# Patient Record
Sex: Female | Born: 1980 | Race: White | Hispanic: No | Marital: Married | Smoking: Never smoker
Health system: Southern US, Community
[De-identification: ages and names within clinical notes are randomized; demographics above are authoritative.]

## PROBLEM LIST (undated history)

## (undated) DIAGNOSIS — Z9889 Other specified postprocedural states: Secondary | ICD-10-CM

## (undated) DIAGNOSIS — N6489 Other specified disorders of breast: Secondary | ICD-10-CM

## (undated) DIAGNOSIS — R112 Nausea with vomiting, unspecified: Secondary | ICD-10-CM

---

## 2007-01-06 ENCOUNTER — Ambulatory Visit (HOSPITAL_COMMUNITY): Admission: RE | Admit: 2007-01-06 | Discharge: 2007-01-06 | Payer: Self-pay | Admitting: Obstetrics and Gynecology

## 2007-03-02 ENCOUNTER — Ambulatory Visit (HOSPITAL_COMMUNITY): Admission: RE | Admit: 2007-03-02 | Discharge: 2007-03-02 | Payer: Self-pay | Admitting: Obstetrics and Gynecology

## 2007-04-03 ENCOUNTER — Ambulatory Visit (HOSPITAL_COMMUNITY): Admission: RE | Admit: 2007-04-03 | Discharge: 2007-04-03 | Payer: Self-pay | Admitting: Obstetrics and Gynecology

## 2010-04-15 ENCOUNTER — Encounter: Payer: Self-pay | Admitting: Obstetrics and Gynecology

## 2011-01-03 LAB — MISCELLANEOUS TEST

## 2019-12-04 ENCOUNTER — Telehealth: Payer: Self-pay | Admitting: Unknown Physician Specialty

## 2019-12-04 ENCOUNTER — Other Ambulatory Visit: Payer: Self-pay | Admitting: Unknown Physician Specialty

## 2019-12-04 DIAGNOSIS — U071 COVID-19: Secondary | ICD-10-CM

## 2019-12-04 NOTE — Telephone Encounter (Signed)
I connected by phone with Shelly Hensley on 12/04/2019 at 10:09 AM to discuss the potential use of a new treatment for mild to moderate COVID-19 viral infection in non-hospitalized patients.  This patient is a 39 y.o. female that meets the FDA criteria for Emergency Use Authorization of COVID monoclonal antibody casirivimab/imdevimab.  Has a (+) direct SARS-CoV-2 viral test result  Has mild or moderate COVID-19   Is NOT hospitalized due to COVID-19  Is within 10 days of symptom onset  Has at least one of the high risk factor(s) for progression to severe COVID-19 and/or hospitalization as defined in EUA.  Specific high risk criteria: unvaccinated   I have spoken and communicated the following to the patient or parent/caregiver regarding COVID monoclonal antibody treatment:  1. FDA has authorized the emergency use for the treatment of mild to moderate COVID-19 in adults and pediatric patients with positive results of direct SARS-CoV-2 viral testing who are 81 years of age and older weighing at least 40 kg, and who are at high risk for progressing to severe COVID-19 and/or hospitalization.  2. The significant known and potential risks and benefits of COVID monoclonal antibody, and the extent to which such potential risks and benefits are unknown.  3. Information on available alternative treatments and the risks and benefits of those alternatives, including clinical trials.  4. Patients treated with COVID monoclonal antibody should continue to self-isolate and use infection control measures (e.g., wear mask, isolate, social distance, avoid sharing personal items, clean and disinfect high touch surfaces, and frequent handwashing) according to CDC guidelines.   5. The patient or parent/caregiver has the option to accept or refuse COVID monoclonal antibody treatment.  After reviewing this information with the patient, The patient agreed to proceed with receiving casirivimab\imdevimab infusion  and will be provided a copy of the Fact sheet prior to receiving the infusion. [name] 12/04/2019 10:09 AM  Sx onset 12/02/2019

## 2019-12-06 ENCOUNTER — Ambulatory Visit (HOSPITAL_COMMUNITY): Payer: Self-pay | Attending: Pulmonary Disease

## 2021-06-14 ENCOUNTER — Other Ambulatory Visit: Payer: Self-pay | Admitting: Obstetrics and Gynecology

## 2021-06-14 DIAGNOSIS — R921 Mammographic calcification found on diagnostic imaging of breast: Secondary | ICD-10-CM

## 2021-06-25 ENCOUNTER — Ambulatory Visit
Admission: RE | Admit: 2021-06-25 | Discharge: 2021-06-25 | Disposition: A | Discharge status: Home / Self Care | Payer: Commercial Managed Care - PPO | Source: Ambulatory Visit | Attending: Obstetrics and Gynecology | Admitting: Obstetrics and Gynecology

## 2021-06-25 DIAGNOSIS — R921 Mammographic calcification found on diagnostic imaging of breast: Secondary | ICD-10-CM

## 2021-06-26 ENCOUNTER — Other Ambulatory Visit: Payer: Self-pay | Admitting: Obstetrics and Gynecology

## 2021-06-26 DIAGNOSIS — R921 Mammographic calcification found on diagnostic imaging of breast: Secondary | ICD-10-CM

## 2021-06-28 DIAGNOSIS — D0592 Unspecified type of carcinoma in situ of left breast: Secondary | ICD-10-CM | POA: Insufficient documentation

## 2021-07-11 ENCOUNTER — Ambulatory Visit
Admission: RE | Admit: 2021-07-11 | Discharge: 2021-07-11 | Disposition: A | Discharge status: Home / Self Care | Payer: Commercial Managed Care - PPO | Source: Ambulatory Visit | Attending: Obstetrics and Gynecology | Admitting: Obstetrics and Gynecology

## 2021-07-11 ENCOUNTER — Encounter: Payer: Self-pay | Admitting: Radiology

## 2021-07-11 DIAGNOSIS — R921 Mammographic calcification found on diagnostic imaging of breast: Secondary | ICD-10-CM

## 2021-07-13 ENCOUNTER — Other Ambulatory Visit: Payer: Self-pay | Admitting: Genetic Counselor

## 2021-07-13 ENCOUNTER — Telehealth: Payer: Self-pay | Admitting: Genetic Counselor

## 2021-07-13 ENCOUNTER — Inpatient Hospital Stay: Payer: Commercial Managed Care - PPO | Attending: Hematology and Oncology

## 2021-07-13 DIAGNOSIS — Z853 Personal history of malignant neoplasm of breast: Secondary | ICD-10-CM

## 2021-07-13 NOTE — Telephone Encounter (Signed)
Received call from Dr. Maxie Barb office that patient needs genetic testing appt prior to surgery.  Surgery scheduled for 5/11.  Called patient to set up blood draw at [facility] for noon on 4/21 and virtual genetics visit at [facility] on 4/24.  Contact information provided.  ? ? ?

## 2021-07-16 ENCOUNTER — Inpatient Hospital Stay (HOSPITAL_BASED_OUTPATIENT_CLINIC_OR_DEPARTMENT_OTHER): Payer: Commercial Managed Care - PPO | Admitting: Genetic Counselor

## 2021-07-16 ENCOUNTER — Encounter: Payer: Self-pay | Admitting: Genetic Counselor

## 2021-07-16 DIAGNOSIS — Z800 Family history of malignant neoplasm of digestive organs: Secondary | ICD-10-CM

## 2021-07-16 DIAGNOSIS — Z8041 Family history of malignant neoplasm of ovary: Secondary | ICD-10-CM

## 2021-07-16 DIAGNOSIS — D0512 Intraductal carcinoma in situ of left breast: Secondary | ICD-10-CM | POA: Diagnosis not present

## 2021-07-16 HISTORY — DX: Family history of malignant neoplasm of digestive organs: Z80.0

## 2021-07-16 HISTORY — DX: Intraductal carcinoma in situ of left breast: D05.12

## 2021-07-16 HISTORY — DX: Family history of malignant neoplasm of ovary: Z80.41

## 2021-07-16 NOTE — Progress Notes (Signed)
REFERRING PROVIDER: ?Pollyann Samples, MD ?[address] ?[place],  [place] [address] ? ?PRIMARY PROVIDER:  ?Marco Collie, MD ? ?PRIMARY REASON FOR VISIT:  ?Encounter Diagnoses  ?Name   ? Ductal carcinoma in situ (DCIS) of left breast   ? Family history of colon cancer   ? Family history of ovarian cancer   ? ? ?HISTORY OF PRESENT ILLNESS:   ?Ms. Hanigan, a 41 y.o. female, was seen for a [facility] cancer genetics consultation at the request of Dr. Lilia Pro due to a personal history of cancer.  Ms. Anfinson presents to clinic today to discuss the possibility of a hereditary predisposition to cancer, to discuss genetic testing, and to further clarify her future cancer risks, as well as potential cancer risks for family members.  ? ?In 2023, at the age of 34, Ms. 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 Treatment plan is pending.  At this point in time, surgery is scheduled for Aug 02, 2021.  ? ? ?RISK FACTORS:  ?Mammogram within the last year: yes ?Colonoscopy: no; not examined. ?Hysterectomy: no. ?Ovaries intact: yes.  ?Up to date with pelvic exams: yes. ?Menarche was at age 83 or 65.  ?First live birth at age 35.  ?Menopausal status: premenopausal.  ?OCP use for approximately 8 years.  ?HRT use: 0 years. ? ? ?FAMILY HISTORY:  ?We obtained a detailed, 4-generation family history.  Significant diagnoses are listed below: ?Family History  ?Problem Relation Age of Onset  ? Skin cancer Father   ?     face; surgery only  ? Colon cancer Maternal Aunt   ?     dx before 57?  ? Cancer Maternal Uncle   ?     larynx; dx after 50  ? Lung cancer Paternal Aunt   ?     dx after 62  ? Lung cancer Paternal Uncle   ?     dx after 12  ? Colon cancer Cousin   ?     dx 20s; paternal female cousin  ? Ovarian cancer Cousin   ?     dx 67s; paternal cousin  ? ? ? ?Ms. Hirota is uanware of previous family history of genetic testing for hereditary cancer risks. There is no reported Ashkenazi Jewish ancestry.  There is no known consanguinity. ? ?GENETIC COUNSELING ASSESSMENT: Ms. Mclin is a 41 y.o. female with a personal history of cancer which is somewhat suggestive of a hereditary cancer syndrome and predisposition to cancer given her age of diagnosis. We, therefore, discussed and recommended the following at today's visit.  ? ?DISCUSSION: We discussed that 5 - 10% of cancer is hereditary, with most cases of hereditary breast cancer associated with mutations in BRCA1/2.  There are other genes that can be associated with hereditary breast cancer syndromes. Type of cancer risk and level of risk are gene-specific. We discussed that testing is beneficial for several reasons including knowing how to follow individuals after completing their treatment, identifying whether potential treatment options would be beneficial, and understanding if other family members could be at risk for cancer and allowing them to undergo genetic testing.  ? ?We reviewed the characteristics, features and inheritance patterns of hereditary cancer syndromes. We also discussed genetic testing, including the appropriate family members to test, the process of testing, insurance coverage and turn-around-time for results. We discussed the implications of a negative, positive and/or variant of uncertain significant result. In order to get genetic  test results in a timely manner so that Ms. Ogden can use these genetic test results for surgical decisions, we recommended Ms. Mellette pursue genetic testing for the [employer].  The BRCAplus panel offered by [facility] and includes sequencing and deletion/duplication analysis for the following 8 genes: ATM, BRCA1, BRCA2, CDH1, CHEK2, PALB2, PTEN, and TP53. Once complete, we recommend Ms. Pidcock pursue reflex genetic testing to a more comprehensive gene panel.  ? ?Ms. Paster  was offered a common hereditary cancer panel (47 genes) and an expanded pan-cancer panel (77 genes). Ms. Vink was informed of  the benefits and limitations of each panel, including that expanded pan-cancer panels contain genes that do not have clear management guidelines at this point in time.  We also discussed that as the number of genes included on a panel increases, the chances of variants of uncertain significance increases.  After considering the benefits and limitations of each gene panel, Ms. Marandola  elected to have an expanded Radio broadcast assistant through [place]. ? ?The CancerNext-Expanded gene panel offered by [facility] and includes sequencing, rearrangement, and RNA analysis for the following 77 genes: AIP, ALK, APC, ATM, AXIN2, BAP1, BARD1, BLM, BMPR1A, BRCA1, BRCA2, BRIP1, CDC73, CDH1, CDK4, CDKN1B, CDKN2A, CHEK2, CTNNA1, DICER1, FANCC, FH, FLCN, GALNT12, KIF1B, LZTR1, MAX, MEN1, MET, MLH1, MSH2, MSH3, MSH6, MUTYH, NBN, NF1, NF2, NTHL1, PALB2, PHOX2B, PMS2, POT1, PRKAR1A, PTCH1, PTEN, RAD51C, RAD51D, RB1, RECQL, RET, SDHA, SDHAF2, SDHB, SDHC, SDHD, SMAD4, SMARCA4, SMARCB1, SMARCE1, STK11, SUFU, TMEM127, TP53, TSC1, TSC2, VHL and XRCC2 (sequencing and deletion/duplication); EGFR, EGLN1, HOXB13, KIT, MITF, PDGFRA, POLD1, and POLE (sequencing only); EPCAM and GREM1 (deletion/duplication only).   ? ?Based on Ms. Qadri's personal history of DCIS before age 65, she meets medical criteria for genetic testing. Despite that she meets criteria, she may still have an out of pocket cost. We discussed that if her out of pocket cost for testing is over $100, the laboratory should contact them to discuss self-pay prices, patient pay assistance programs, if applicable, and other billing options.  ? ?PLAN: After considering the risks, benefits, and limitations, Ms. Mccaster provided informed consent to pursue genetic testing and the blood sample was sent to [facility] for analysis of the CancerNext-Expanded +RNAinsight panel. Results should be available within approximately 1-2 weeks' time, at which point they will be disclosed by telephone  to Ms. Kotecki, as will any additional recommendations warranted by these results. Ms. Meloche will receive a summary of her genetic counseling visit and a copy of her results once available. This information will also be available in Epic.  ? ?Lastly, we encouraged Ms. Cappelletti to remain in contact with cancer genetics annually so that we can continuously update the family history and inform her of any changes in cancer genetics and testing that may be of benefit for this family.  ? ?Ms. Herendeen's questions were answered to her satisfaction today. Our contact information was provided should additional questions or concerns arise. Thank you for the referral and allowing [place] to share in the care of your patient.  ? ?Darrious Youman M. Joette Catching, [place], [place] ?Genetic Counselor ?[email or web address] ?(P) [phone] ? ?The patient was seen for a total of 25 minutes in audio and video genetic counseling.  The patient was seen alone.  Drs. Magrinat, Lindi Adie and/or Burr Medico were available to discuss this case as needed.  ?_______________________________________________________________________ ?For Office Staff:  ?Number of people involved in session: 1 ?Was an Intern/ student involved with case: no ? ?

## 2021-07-23 ENCOUNTER — Encounter: Payer: Self-pay | Admitting: Genetic Counselor

## 2021-07-23 ENCOUNTER — Telehealth: Payer: Self-pay | Admitting: Genetic Counselor

## 2021-07-23 ENCOUNTER — Ambulatory Visit: Payer: Self-pay | Admitting: Genetic Counselor

## 2021-07-23 DIAGNOSIS — Z800 Family history of malignant neoplasm of digestive organs: Secondary | ICD-10-CM

## 2021-07-23 DIAGNOSIS — Z1501 Genetic susceptibility to malignant neoplasm of breast: Secondary | ICD-10-CM

## 2021-07-23 DIAGNOSIS — D0512 Intraductal carcinoma in situ of left breast: Secondary | ICD-10-CM

## 2021-07-23 DIAGNOSIS — Z8041 Family history of malignant neoplasm of ovary: Secondary | ICD-10-CM

## 2021-07-23 DIAGNOSIS — Z1379 Encounter for other screening for genetic and chromosomal anomalies: Secondary | ICD-10-CM

## 2021-07-23 HISTORY — DX: Genetic susceptibility to malignant neoplasm of breast: Z15.01

## 2021-07-23 HISTORY — DX: Encounter for other screening for genetic and chromosomal anomalies: Z13.79

## 2021-07-23 NOTE — Telephone Encounter (Signed)
Called patient to discuss results of breast STAT panel.  Revealed CHEK2 positive.  Discussed cancer risks, management, and family implications.  ? ? ? ?

## 2021-07-23 NOTE — Progress Notes (Signed)
GENETIC TEST RESULTS ? ?Patient Name: Shelly Hensley ?Patient Age: 41 y.o. ?Encounter Date: 07/23/2021 ? ?Referring Provider: ?Orrin Brigham, MD ? ?[name] was seen in the [facility] clinic on July 16, 2021 due to a personal and family history of cancer and concern regarding a hereditary predisposition to cancer in the family. Please refer to the prior Genetics clinic note for more information regarding Shelly Hensley's medical and family histories and our assessment at the time.  ? 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  ?  ? ?Family History:  ?We obtained a detailed, 4-generation family history.  Significant diagnoses are listed below: ?Family History  ?Problem Relation Age of Onset  ? Skin cancer Father   ?     face; surgery only  ? Colon cancer Maternal Aunt   ?     dx before 81?  ? Cancer Maternal Uncle   ?     larynx; dx after 50  ? Lung cancer Paternal Aunt   ?     dx after 79  ? Lung cancer Paternal Uncle   ?     dx after 49  ? Colon cancer Cousin   ?     dx 31s; paternal female cousin  ? Ovarian cancer Cousin   ?     dx 18s; paternal cousin  ?  ?  ?Shelly Hensley is uanware of previous family history of genetic testing for hereditary cancer risks. There is no reported Ashkenazi Jewish ancestry. There is no known consanguinity. ? ?Genetic testing:  ? ? 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 Specifically, this variant is c.1100delC ([id number]).  No other pathogenic variants were detected in the [facility] Panel.  The BRCAplus panel offered by [facility] and includes sequencing and deletion/duplication analysis for the following 8 genes: ATM, BRCA1, BRCA2, CDH1, CHEK2, PALB2, PTEN, and TP53.  Results of pan-cancer panel are pending. ? ?The test report has been scanned into EPIC and is located under the Molecular Pathology section of the Results Review tab.  A portion of the result report is  included below for reference. Genetic testing reported out on Jul 23, 2021.  ? ? ? ? ? ?Cancer Risks for CHEK2: ?Women have a 20-40% lifetime risk of breast cancer. ?Men are thought to be at an increased risk of prostate cancer and possibly female breast cancer. The exact risk figure is unknown at this time. ?8-9% lifetime risk of colorectal cancer ?Data suggests a possible increased risk for ovarian, endometrial, thyroid, and other types of cancer  ?  ?Research is continuing to help learn more about the cancers associated with CHEK2 pathogenic variants and what the exact risks are to develop these cancers. ? ?Management Recommendations: ? ?Breast Cancer Screening/Risk Reduction: ?Women: ?Breast cancer screening includes: ?Breast awareness beginning at age 7 ?Monthly self-breast examination beginning at age 68 ?Clinical breast examination every 6-12 months beginning at age 39 or at the age of the earliest diagnosed breast cancer in the family, if onset was before age 80 ?Annual mammogram with consideration of tomosynthesis starting at age 73 or 32 years prior to the youngest age of diagnosis, whichever comes first ?Consider breast MRI with contrast starting at age 11-35 ?Evidence is insufficient for a prophylactic risk-reducing mastectomy, manage based on family history  ? ?Men: ?There are currently no specific medical management guidelines for female breast cancer. However,  as an association has been seen with CHEK2 mutations and female breast cancer, we recommend males consider annual clinical breast exams beginning at age 37. ? ?Colon Cancer Screening: ?Colonoscopy screening every 5 years beginning at age 84 ?If an individual has a first-degree relative with colorectal cancer, screening should begin 10 years prior to the relative?s age at diagnosis if before 35. ? ? ?Prostate Cancer Screening: ?It has been suggested that men with a CHEK2 pathogenic variant and a first-degree relative with prostate cancer have an annual  prostate-specific antigen (PSA). However, the benefits of screening for prostate cancer among men with a pathogenic variant in CHEK2 are uncertain. ?Consider beginning annual PSA blood test and digital rectal exams at age 50-45 ? ? ?This information is based on current understanding of the gene and may change in the future. ? ? ?Implications for Family Members: ?Hereditary predisposition to cancer due to pathogenic variants in the CHEK2 gene has autosomal dominant inheritance. This means that an individual with a pathogenic variant has a 50% chance of passing the condition on to his/her offspring. Identification of a pathogenic variant allows for the recognition of at-risk relatives who can pursue testing for the familial variant.  ? ?Family members are encouraged to consider genetic testing for this familial pathogenic variant. As there are generally no childhood cancer risks associated with pathogenic variants in the CHEK2 gene, individuals in the family are not recommended to have testing until they reach at least 41 years of age. Complimentary testing for the familial variant is available for 90 days. They may contact our office at [phone] for more information or to schedule an appointment. Family members who live outside of the area are encouraged to find a genetic counselor in their area by visiting: [email or web address].  ? ?Resources: ?FORCE (Facing Our Risk of Cancer Empowered) is a resource for those with a hereditary predisposition to develop cancer.  FORCE provides information about risk reduction, advocacy, legislation, and clinical trials.  Additionally, FORCE provides a platform for collaboration and support; which includes: peer navigation, message boards, local support groups, a toll-free helpline, research registry and recruitment, advocate training, published medical research, webinars, brochures, mastectomy photos, and more.  For more information, visit  www.facingourrisk.org ? ?Plan: ?Future breast surveillance for Shelly Hensley should include annual mammogram with consideration for annual breast MRI.  ?Ms. Joffe should begin colonoscopies and have them every 5 years.  ?Ms. Haywood's future medical oncologist will be notified of these recommendations so that appropriate referrals can be placed.  ?We discussed with Ms. Voshell that her parents and siblings should have genetic testing.  More distant relatives also have a higher chance of this mutation.  She was provided with a family sharing letter to help in sharing information about CHEK2.  ? ?Our contact number was provided. Ms. Finazzo questions were answered to her satisfaction, and she knows she is welcome to call us at anytime with additional questions or concerns.  ? ?Rigel Filsinger M. Joette Catching, MS, [place] ?Certified Genetic Counselor ?[email or web address] ?(P) [phone] ? ? ?

## 2021-07-25 ENCOUNTER — Institutional Professional Consult (permissible substitution): Payer: Commercial Managed Care - PPO | Admitting: Plastic Surgery

## 2021-07-31 ENCOUNTER — Telehealth: Payer: Self-pay | Admitting: Genetic Counselor

## 2021-07-31 NOTE — Telephone Encounter (Signed)
Called patients to discuss results of pan-cancer panel.  LVM requesting call back.  ? ? ?

## 2021-08-01 ENCOUNTER — Telehealth: Payer: Self-pay | Admitting: Genetic Counselor

## 2021-08-01 NOTE — Telephone Encounter (Signed)
Revealed results of pan-cancer panel.  Reviewed pathogenic variant in CHEK2 and discussed VUS in CTNNA1.  ? ? ?

## 2021-08-10 ENCOUNTER — Ambulatory Visit: Payer: Commercial Managed Care - PPO | Admitting: Oncology

## 2021-08-10 ENCOUNTER — Other Ambulatory Visit: Payer: Commercial Managed Care - PPO

## 2021-08-14 ENCOUNTER — Telehealth: Payer: Self-pay | Admitting: *Deleted

## 2021-08-14 NOTE — Telephone Encounter (Signed)
Breast MRI report (DOS: 08/13/2021) received from [facility]. Copy forwarded to Dr. Claudia Desanctis. Report sent to batch scanning

## 2021-08-22 ENCOUNTER — Encounter: Payer: Self-pay | Admitting: Plastic Surgery

## 2021-08-22 ENCOUNTER — Ambulatory Visit: Payer: Commercial Managed Care - PPO | Admitting: Plastic Surgery

## 2021-08-22 VITALS — BP 138/86 | HR 70 | Ht 62.5 in | Wt 139.2 lb

## 2021-08-22 DIAGNOSIS — D0512 Intraductal carcinoma in situ of left breast: Secondary | ICD-10-CM

## 2021-08-22 NOTE — Progress Notes (Signed)
Referring Provider Marco Collie, MD [place],  [place] [address]   CC:  Chief Complaint  Patient presents with   Consult           Shelly Hensley is an 41 y.o. female.  HPI: Patient presents to discuss breast reconstruction.  She has a new left-sided breast cancer picked up on mammogram.  She has seen Dr. Lilia Pro as her breast surgeon.  She is deciding between lumpectomy and radiation versus bilateral mastectomies.  No previous breast biopsies or procedures prior to this.  She is currently a C cup and what would like to stay about the same size.  She does not smoke and is not a diabetic.  She also states that she has had a genetic mutation identified that puts her at a higher risk for developing breast cancer in the future.  Allergies  Allergen Reactions   Other Other (See Comments)    unknown    Outpatient Encounter Medications as of 08/22/2021  Medication Sig   Cholecalciferol 100 MCG (4000 UT) CAPS Take 1 tablet by mouth daily.   OVER THE COUNTER MEDICATION Juice Plus-Taking daily.   No facility-administered encounter medications on file as of 08/22/2021.     Past Medical History:  Diagnosis Date   Ductal carcinoma in situ (DCIS) of left breast 07/16/2021   Family history of colon cancer 07/16/2021   Family history of ovarian cancer 07/16/2021   Genetic testing 07/23/2021   CHEK2 mutation detected at c.1100delC [id number]).  No other pathogenic variants detected in [facility] Panel.  Report date is Jul 23, 2021.   The BRCAplus panel offered by [facility] and includes sequencing and deletion/duplication analysis for the following 8 genes: ATM, BRCA1, BRCA2, CDH1, CHEK2, PALB2, PTEN, and TP53.  Results of pan-cancer panel are pending.    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  Family History  Problem Relation Age of Onset   Skin cancer Father        face; surgery only   Colon cancer Maternal Aunt        dx before  67?   Cancer Maternal Uncle        larynx; dx after 7   Lung cancer Paternal Aunt        dx after 85   Lung cancer Paternal Uncle        dx after 23   Colon cancer Cousin        dx 110s; paternal female cousin   Ovarian cancer Cousin        dx 34s; paternal cousin    Social History   Social History Narrative   Not on file     Review of Systems General: Denies fevers, chills, weight loss CV: Denies chest pain, shortness of breath, palpitations  Physical Exam    08/22/2021   12:40 PM  Vitals with BMI  Height 5' 2.5"  Weight 139 lbs 3 oz  BMI 19.37  Systolic 902  Diastolic 86  Pulse 70    General:  No acute distress,  Alert and oriented, Non-Toxic, Normal speech and affect Breast: She has grade 2-3 ptosis.  She is reasonably symmetric.  Biopsy sites inferior to the areola on the left side.  Around a C cup in volume.  Base width 11 cm  Assessment/Plan We discussed immediate breast reconstruction with tissue expanders and acellular dermal matrix.  I did talk to her about the pros and  cons of nipple sparing assuming she is a candidate from an oncologic standpoint.  Due to her ptosis there would be a increased risk of nipple malposition with nipple sparing.  I do think it would be possible if she thinks about it and feels strongly about nipple preservation.  She is going to talk about this further with Dr. Lilia Pro as well.  I went over the stage nature of expander to implant placement.  I did bring up the added benefit of getting to choose her ultimate size with the expander versus direct implant.  We discussed risks include bleeding, infection, damage to surrounding structures need for additional procedures.  We discussed wound healing complications that might result in loss of the implant.  We discussed the need for drains postoperatively and the general timeline and recovery anticipated with staged implant-based reconstruction.  All of her questions were answered.  She is still on the  fence and would like to think about it but I will be happy to coordinate with Dr. Lilia Pro if it turns out she wants to go ahead with mastectomies and reconstruction.  [name] 08/22/2021, 2:07 PM

## 2021-09-11 ENCOUNTER — Ambulatory Visit: Payer: Self-pay | Admitting: Surgery

## 2021-09-17 ENCOUNTER — Other Ambulatory Visit: Payer: Self-pay

## 2021-09-17 ENCOUNTER — Encounter (HOSPITAL_BASED_OUTPATIENT_CLINIC_OR_DEPARTMENT_OTHER): Payer: Self-pay | Admitting: Plastic Surgery

## 2021-09-20 ENCOUNTER — Encounter: Payer: Commercial Managed Care - PPO | Admitting: Surgical

## 2021-09-26 ENCOUNTER — Encounter: Payer: Self-pay | Admitting: Surgical

## 2021-09-26 ENCOUNTER — Ambulatory Visit (INDEPENDENT_AMBULATORY_CARE_PROVIDER_SITE_OTHER): Payer: Commercial Managed Care - PPO | Admitting: Surgical

## 2021-09-26 ENCOUNTER — Telehealth: Payer: Self-pay

## 2021-09-26 VITALS — BP 131/85 | HR 67 | Ht 62.5 in | Wt 135.0 lb

## 2021-09-26 DIAGNOSIS — D0512 Intraductal carcinoma in situ of left breast: Secondary | ICD-10-CM

## 2021-09-26 MED ORDER — OXYCODONE HCL 5 MG PO TABS: 5.0000 mg | ORAL_TABLET | Freq: Four times a day (QID) | ORAL | 0 refills | Status: AC | PRN

## 2021-09-26 MED ORDER — FLUCONAZOLE 150 MG PO TABS
150.0000 mg | ORAL_TABLET | Freq: Once | ORAL | 0 refills | Status: AC
Start: 2021-09-26 — End: 2021-09-26

## 2021-09-26 MED ORDER — SULFAMETHOXAZOLE-TRIMETHOPRIM 800-160 MG PO TABS: 1.0000 | ORAL_TABLET | Freq: Two times a day (BID) | ORAL | 0 refills | Status: AC

## 2021-09-26 MED ORDER — ONDANSETRON HCL 4 MG PO TABS: 4.0000 mg | ORAL_TABLET | Freq: Three times a day (TID) | ORAL | 0 refills | Status: DC | PRN

## 2021-09-26 NOTE — Progress Notes (Signed)
Patient ID: Shelly Hensley, female    DOB: 1981/01/02, 41 y.o.   MRN: [id number]  Chief Complaint  Patient presents with   Pre-op Exam      ICD-10-CM   1. Ductal carcinoma in situ (DCIS) of left breast  D05.12       History of Present Illness: Shelly Hensley is a 41 y.o.  female  with a history of left-sided breast cancer.  She presents for preoperative evaluation for upcoming procedure, immediate bilateral breast reconstruction and placement of bilateral breast tissue expanders and Flex HD, scheduled for 10/02/2021 with Dr.  Claudia Desanctis after bilateral simple mastectomy by Dr. Lilia Pro.  She presents today with her husband.  She has no personal history of anesthesia, no known family history of complications.  No history of DVT/PE.  No family or personal history of bleeding or clotting disorders.  Patient is not currently taking any blood thinners.  No history of CVA/MI.  Father with past medical history of DVT, however this was provoked by severe illness while he was in the ICU.  No other family history of VTE.  Summary of Previous Visit: Patient with new left-sided breast cancer picked up a mammogram, no previous breast biopsies or procedures.  She is currently a C cup and would like to stay about the same size.  Not a smoker, not a diabetic.  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 Lilia Pro, we discussed she may need additional time off work due to the lifting requirements.  PMH Significant for: DCIS of left breast  Chemotherapy/radiation: No current plans  Patient was recently seen by OB/GYN for evaluation of pelvic pressure, she was treated for presumptive UTI about 1 month ago.  She has been referred to urology for further evaluation and pelvic ultrasound as well.  Patient reports she is feeling well today, no other recent changes to her health.  She is not having any infectious symptoms.  She has no cardiac or pulmonary disease.  She is  interested in discussing nipple areolar tattoo and we discussed testing for colors today while she is in the office.  Past Medical History: Allergies: Allergies  Allergen Reactions   Other Other (See Comments)    Tree nuts    Current Medications:  Current Outpatient Medications:    fluconazole (DIFLUCAN) 150 MG tablet, Take 1 tablet (150 mg total) by mouth once for 1 dose., Disp: 2 tablet, Rfl: 0   ondansetron (ZOFRAN) 4 MG tablet, Take 1 tablet (4 mg total) by mouth every 8 (eight) hours as needed for nausea or vomiting., Disp: 20 tablet, Rfl: 0   oxyCODONE (OXY IR/ROXICODONE) 5 MG immediate release tablet, Take 1 tablet (5 mg total) by mouth every 6 (six) hours as needed for up to 5 days for severe pain., Disp: 20 tablet, Rfl: 0   sulfamethoxazole-trimethoprim (BACTRIM DS) 800-160 MG tablet, Take 1 tablet by mouth 2 (two) times daily for 14 days., Disp: 28 tablet, Rfl: 0   Cholecalciferol 100 MCG (4000 UT) CAPS, Take 1 tablet by mouth daily., Disp: , Rfl:    OVER THE COUNTER MEDICATION, Juice Plus-Taking daily., Disp: , Rfl:   Past Medical Problems: Past Medical History:  Diagnosis Date   Ductal carcinoma in situ (DCIS) of left breast 07/16/2021   Family history of colon cancer 07/16/2021   Family history of ovarian cancer 07/16/2021   Genetic testing 07/23/2021   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  No other pathogenic variants detected in [facility] Panel.  Report date is Jul 23, 2021.   The BRCAplus panel offered by [facility] and includes sequencing and deletion/duplication analysis for the following 8 genes: ATM, BRCA1, BRCA2, CDH1, CHEK2, PALB2, PTEN, and TP53.  Results of pan-cancer panel are pending.    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    Past Surgical History: No past surgical history on file.  Social History: Social History   Socioeconomic History   Marital status: Married    Spouse name: Not on file   Number  of children: Not on file   Years of education: Not on file   Highest education level: Not on file  Occupational History   Not on file  Tobacco Use   Smoking status: Never   Smokeless tobacco: Never  Vaping Use   Vaping Use: Never used  Substance and Sexual Activity   Alcohol use: Never   Drug use: Never   Sexual activity: Yes  Other Topics Concern   Not on file  Social History Narrative   Not on file   Social Determinants of Health   Financial Resource Strain: Not on file  Food Insecurity: Not on file  Transportation Needs: Not on file  Physical Activity: Not on file  Stress: Not on file  Social Connections: Not on file  Intimate Partner Violence: Not on file    Family History: Family History  Problem Relation Age of Onset   Skin cancer Father        face; surgery only   Colon cancer Maternal Aunt        dx before 34?   Cancer Maternal Uncle        larynx; dx after 74   Lung cancer Paternal Aunt        dx after 57   Lung cancer Paternal Uncle        dx after 75   Colon cancer Cousin        dx 79s; paternal female cousin   Ovarian cancer Cousin        dx 78s; paternal cousin    Review of Systems: Review of Systems  Constitutional: Negative.   Respiratory: Negative.    Cardiovascular: Negative.   Gastrointestinal: Negative.   Skin: Negative.   Neurological: Negative.     Physical Exam: Vital Signs BP 131/85 (BP Location: Left Arm, Patient Position: Sitting, Cuff Size: Normal)   Pulse 67   Ht 5' 2.5" (1.588 m)   Wt 135 lb (61.2 kg)   SpO2 99%   BMI 24.30 kg/m   Physical Exam  Constitutional:      General: Not in acute distress.    Appearance: Normal appearance. Not ill-appearing.  HENT:     Head: Normocephalic and atraumatic.  Eyes:     Pupils: Pupils are equal, round Neck:     Musculoskeletal: Normal range of motion.  Cardiovascular:     Rate and Rhythm: Normal rate    Pulses: Normal pulses.  Pulmonary:     Effort: Pulmonary effort is  normal. No respiratory distress.  Musculoskeletal: Normal range of motion.  Skin:    General: Skin is warm and dry.     Findings: No erythema or rash.  Neurological:     General: No focal deficit present.     Mental Status: Alert and oriented to person, place, and time. Mental status is at baseline.     Motor: No weakness.  Psychiatric:  Mood and Affect: Mood normal.        Behavior: Behavior normal.    Assessment/Plan: The patient is scheduled for immediate bilateral breast reconstruction with placement of tissue expanders and Flex HD with Dr. Claudia Desanctis.  Risks, benefits, and alternatives of procedure discussed, questions answered and consent obtained.    Smoking Status: Non-smoker; Counseling Given? N/A  Caprini Score: 8, high; Risk Factors include: Age, father with history of DVT while in ICU, and length of planned surgery. Recommendation for mechanical and possible pharmacological prophylaxis.  Will discuss with surgeon need for Lovenox postoperatively, however given that her father's DVT was provoked, DVT chemoprophylaxis may not be necessary.  Encourage early ambulation.   Pictures obtained: Pictures were obtained of the patient and placed in the chart with the patient's or guardian's permission.  Post-op Rx sent to pharmacy: Oxycodone, Zofran, Bactrim and Diflucan  Patient was provided with the breast reconstruction and General Surgical Risk consent document and Pain Medication Agreement prior to their appointment.  They had adequate time to read through the risk consent documents and Pain Medication Agreement. We also discussed them in person together during this preop appointment. All of their questions were answered to their satisfaction.  Recommended calling if they have any further questions.  Risk consent form and Pain Medication Agreement to be scanned into patient's chart.  The risks that can be encountered with and after placement of a breast expander placement were  discussed and include the following but not limited to these: bleeding, infection, delayed healing, anesthesia risks, skin sensation changes, injury to structures including nerves, blood vessels, and muscles which may be temporary or permanent, allergies to tape, suture materials and glues, blood products, topical preparations or injected agents, skin contour irregularities, skin discoloration and swelling, deep vein thrombosis, cardiac and pulmonary complications, pain, which may persist, fluid accumulation, wrinkling of the skin over the expander, changes in nipple or breast sensation, expander leakage or rupture, faulty position of the expander, persistent pain, formation of tight scar tissue around the expander (capsular contracture), possible need for revisional surgery or staged procedures.  We discussed nipple areolar tattooing today, we used various colors to find a compatible match for her natural nipple areola color.  Believe a mixture of warm honey to fair honey would be a good option, the warm honey was slightly darker and the fair Honey was slightly lighter in color.  Electronically signed by: Carola Rhine Scheeler, PA-C 09/26/2021 11:38 AM

## 2021-09-26 NOTE — Telephone Encounter (Signed)
Faxed information to [facility]. Confirmed receipt of fax. Sending to scan

## 2021-09-26 NOTE — H&P (View-Only) (Signed)
Patient ID: Shelly Hensley, female    DOB: 01/17/1981, 41 y.o.   MRN: [id number]  Chief Complaint  Patient presents with   Pre-op Exam      ICD-10-CM   1. Ductal carcinoma in situ (DCIS) of left breast  D05.12       History of Present Illness: Shelly Hensley is a 41 y.o.  female  with a history of left-sided breast cancer.  She presents for preoperative evaluation for upcoming procedure, immediate bilateral breast reconstruction and placement of bilateral breast tissue expanders and Flex HD, scheduled for 10/02/2021 with Dr.  Claudia Desanctis after bilateral simple mastectomy by Dr. Lilia Pro.  She presents today with her husband.  She has no personal history of anesthesia, no known family history of complications.  No history of DVT/PE.  No family or personal history of bleeding or clotting disorders.  Patient is not currently taking any blood thinners.  No history of CVA/MI.  Father with past medical history of DVT, however this was provoked by severe illness while he was in the ICU.  No other family history of VTE.  Summary of Previous Visit: Patient with new left-sided breast cancer picked up a mammogram, no previous breast biopsies or procedures.  She is currently a C cup and would like to stay about the same size.  Not a smoker, not a diabetic.  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 Lilia Pro, we discussed she may need additional time off work due to the lifting requirements.  PMH Significant for: DCIS of left breast  Chemotherapy/radiation: No current plans  Patient was recently seen by OB/GYN for evaluation of pelvic pressure, she was treated for presumptive UTI about 1 month ago.  She has been referred to urology for further evaluation and pelvic ultrasound as well.  Patient reports she is feeling well today, no other recent changes to her health.  She is not having any infectious symptoms.  She has no cardiac or pulmonary disease.  She is  interested in discussing nipple areolar tattoo and we discussed testing for colors today while she is in the office.  Past Medical History: Allergies: Allergies  Allergen Reactions   Other Other (See Comments)    Tree nuts    Current Medications:  Current Outpatient Medications:    fluconazole (DIFLUCAN) 150 MG tablet, Take 1 tablet (150 mg total) by mouth once for 1 dose., Disp: 2 tablet, Rfl: 0   ondansetron (ZOFRAN) 4 MG tablet, Take 1 tablet (4 mg total) by mouth every 8 (eight) hours as needed for nausea or vomiting., Disp: 20 tablet, Rfl: 0   oxyCODONE (OXY IR/ROXICODONE) 5 MG immediate release tablet, Take 1 tablet (5 mg total) by mouth every 6 (six) hours as needed for up to 5 days for severe pain., Disp: 20 tablet, Rfl: 0   sulfamethoxazole-trimethoprim (BACTRIM DS) 800-160 MG tablet, Take 1 tablet by mouth 2 (two) times daily for 14 days., Disp: 28 tablet, Rfl: 0   Cholecalciferol 100 MCG (4000 UT) CAPS, Take 1 tablet by mouth daily., Disp: , Rfl:    OVER THE COUNTER MEDICATION, Juice Plus-Taking daily., Disp: , Rfl:   Past Medical Problems: Past Medical History:  Diagnosis Date   Ductal carcinoma in situ (DCIS) of left breast 07/16/2021   Family history of colon cancer 07/16/2021   Family history of ovarian cancer 07/16/2021   Genetic testing 07/23/2021   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  No other pathogenic variants detected in [facility] Panel.  Report date is Jul 23, 2021.   The BRCAplus panel offered by [facility] and includes sequencing and deletion/duplication analysis for the following 8 genes: ATM, BRCA1, BRCA2, CDH1, CHEK2, PALB2, PTEN, and TP53.  Results of pan-cancer panel are pending.    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    Past Surgical History: No past surgical history on file.  Social History: Social History   Socioeconomic History   Marital status: Married    Spouse name: Not on file   Number  of children: Not on file   Years of education: Not on file   Highest education level: Not on file  Occupational History   Not on file  Tobacco Use   Smoking status: Never   Smokeless tobacco: Never  Vaping Use   Vaping Use: Never used  Substance and Sexual Activity   Alcohol use: Never   Drug use: Never   Sexual activity: Yes  Other Topics Concern   Not on file  Social History Narrative   Not on file   Social Determinants of Health   Financial Resource Strain: Not on file  Food Insecurity: Not on file  Transportation Needs: Not on file  Physical Activity: Not on file  Stress: Not on file  Social Connections: Not on file  Intimate Partner Violence: Not on file    Family History: Family History  Problem Relation Age of Onset   Skin cancer Father        face; surgery only   Colon cancer Maternal Aunt        dx before 60?   Cancer Maternal Uncle        larynx; dx after 50   Lung cancer Paternal Aunt        dx after 50   Lung cancer Paternal Uncle        dx after 50   Colon cancer Cousin        dx 40s; paternal female cousin   Ovarian cancer Cousin        dx 40s; paternal cousin    Review of Systems: Review of Systems  Constitutional: Negative.   Respiratory: Negative.    Cardiovascular: Negative.   Gastrointestinal: Negative.   Skin: Negative.   Neurological: Negative.     Physical Exam: Vital Signs BP 131/85 (BP Location: Left Arm, Patient Position: Sitting, Cuff Size: Normal)   Pulse 67   Ht 5' 2.5" (1.588 m)   Wt 135 lb (61.2 kg)   SpO2 99%   BMI 24.30 kg/m   Physical Exam  Constitutional:      General: Not in acute distress.    Appearance: Normal appearance. Not ill-appearing.  HENT:     Head: Normocephalic and atraumatic.  Eyes:     Pupils: Pupils are equal, round Neck:     Musculoskeletal: Normal range of motion.  Cardiovascular:     Rate and Rhythm: Normal rate    Pulses: Normal pulses.  Pulmonary:     Effort: Pulmonary effort is  normal. No respiratory distress.  Musculoskeletal: Normal range of motion.  Skin:    General: Skin is warm and dry.     Findings: No erythema or rash.  Neurological:     General: No focal deficit present.     Mental Status: Alert and oriented to person, place, and time. Mental status is at baseline.     Motor: No weakness.  Psychiatric:          Mood and Affect: Mood normal.        Behavior: Behavior normal.    Assessment/Plan: The patient is scheduled for immediate bilateral breast reconstruction with placement of tissue expanders and Flex HD with Dr. Pace.  Risks, benefits, and alternatives of procedure discussed, questions answered and consent obtained.    Smoking Status: Non-smoker; Counseling Given? N/A  Caprini Score: 8, high; Risk Factors include: Age, father with history of DVT while in ICU, and length of planned surgery. Recommendation for mechanical and possible pharmacological prophylaxis.  Will discuss with surgeon need for Lovenox postoperatively, however given that her father's DVT was provoked, DVT chemoprophylaxis may not be necessary.  Encourage early ambulation.   Pictures obtained: Pictures were obtained of the patient and placed in the chart with the patient's or guardian's permission.  Post-op Rx sent to pharmacy: Oxycodone, Zofran, Bactrim and Diflucan  Patient was provided with the breast reconstruction and General Surgical Risk consent document and Pain Medication Agreement prior to their appointment.  They had adequate time to read through the risk consent documents and Pain Medication Agreement. We also discussed them in person together during this preop appointment. All of their questions were answered to their satisfaction.  Recommended calling if they have any further questions.  Risk consent form and Pain Medication Agreement to be scanned into patient's chart.  The risks that can be encountered with and after placement of a breast expander placement were  discussed and include the following but not limited to these: bleeding, infection, delayed healing, anesthesia risks, skin sensation changes, injury to structures including nerves, blood vessels, and muscles which may be temporary or permanent, allergies to tape, suture materials and glues, blood products, topical preparations or injected agents, skin contour irregularities, skin discoloration and swelling, deep vein thrombosis, cardiac and pulmonary complications, pain, which may persist, fluid accumulation, wrinkling of the skin over the expander, changes in nipple or breast sensation, expander leakage or rupture, faulty position of the expander, persistent pain, formation of tight scar tissue around the expander (capsular contracture), possible need for revisional surgery or staged procedures.  We discussed nipple areolar tattooing today, we used various colors to find a compatible match for her natural nipple areola color.  Believe a mixture of warm honey to fair honey would be a good option, the warm honey was slightly darker and the fair Honey was slightly lighter in color.  Electronically signed by: Yisel Megill J Emmanual Gauthreaux, PA-C 09/26/2021 11:38 AM 

## 2021-09-27 ENCOUNTER — Telehealth: Payer: Self-pay | Admitting: *Deleted

## 2021-09-27 NOTE — Telephone Encounter (Signed)
[facility] Signed by Dr. Claudia Desanctis and faxed to [phone] confirmation received. Original sent for batch scanning

## 2021-09-29 ENCOUNTER — Telehealth (INDEPENDENT_AMBULATORY_CARE_PROVIDER_SITE_OTHER): Payer: Commercial Managed Care - PPO | Admitting: Plastic Surgery

## 2021-09-29 ENCOUNTER — Encounter: Payer: Self-pay | Admitting: Plastic Surgery

## 2021-09-29 DIAGNOSIS — Z1379 Encounter for other screening for genetic and chromosomal anomalies: Secondary | ICD-10-CM

## 2021-09-29 DIAGNOSIS — Z1501 Genetic susceptibility to malignant neoplasm of breast: Secondary | ICD-10-CM | POA: Diagnosis not present

## 2021-09-29 DIAGNOSIS — D0512 Intraductal carcinoma in situ of left breast: Secondary | ICD-10-CM

## 2021-09-29 DIAGNOSIS — Z1502 Genetic susceptibility to malignant neoplasm of ovary: Secondary | ICD-10-CM | POA: Diagnosis not present

## 2021-09-29 NOTE — Progress Notes (Signed)
Patient ID: Shelly Hensley, female    DOB: 09-17-1980, 41 y.o.   MRN: [id number]   Chief Complaint  Patient presents with   Breast Cancer    The patient is a 41 year old female joining me by telemetry visit with her husband for consultation for breast reconstruction.  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  She also has the CHEK 2 mutation.  She is otherwise in good health.  She is seeing Dr. Lilia Pro again.  She has not had previous breast surgery.  She is 5 feet 2 inches tall and weighs 139 pounds.  Her preoperative bra size is a C.  She would like to be around the same cup size or slightly smaller.  She has grade 2-3 ptosis at present.  The biopsy site came from the left breast inferior to the areola.  She had previously discussed reconstruction with another surgeon in the practice.  They had initially decided on prepec implant placement.  Today we discussed all the options.     Review of Systems  Constitutional: Negative.   HENT: Negative.    Eyes: Negative.   Respiratory: Negative.    Cardiovascular: Negative.   Gastrointestinal: Negative.   Endocrine: Negative.   Genitourinary: Negative.   Neurological: Negative.   Hematological: Negative.     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  No other pathogenic variants detected in [facility] Panel.  Report date is Jul 23, 2021.   The BRCAplus panel offered by [facility] and includes sequencing and deletion/duplication analysis for the following 8 genes: ATM, BRCA1, BRCA2, CDH1, CHEK2, PALB2, PTEN, and TP53.  Results of pan-cancer panel are pending.    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    History reviewed. No pertinent surgical history.     Current Outpatient Medications:    Cholecalciferol 100 MCG (4000 UT) CAPS, Take 1 tablet by mouth daily., Disp: , Rfl:    ondansetron (ZOFRAN) 4 MG tablet, Take 1 tablet (4 mg total) by mouth every 8 (eight) hours as needed for nausea or vomiting., Disp: 20 tablet, Rfl: 0   OVER THE COUNTER MEDICATION, Juice Plus-Taking daily., Disp: , Rfl:    oxyCODONE (OXY IR/ROXICODONE) 5 MG immediate release tablet, Take 1 tablet (5 mg total) by mouth every 6 (six) hours as needed for up to 5 days for severe pain., Disp: 20 tablet, Rfl: 0   sulfamethoxazole-trimethoprim (BACTRIM DS) 800-160 MG tablet, Take 1 tablet by mouth 2 (two) times daily for 14 days., Disp: 28 tablet, Rfl: 0   Objective:   There were no vitals filed for this visit.  Physical Exam  Assessment & Plan:  Ductal carcinoma in situ (DCIS) of left breast  Genetic testing  Monoallelic mutation of CHEK2 gene in female patient  We had a great conversation and discussed the options for putting the expander above or below the muscle.  The patient has decided she would like to have the expander placed under the muscle.  We will move forward for Tuesday surgery with Dr. Lilia Pro with immediate breast reconstruction with expander and Flex HD placement.  I connected with  Shelly Hensley on 09/29/21 by a video enabled telemedicine application and verified that I am  speaking with the correct person using two identifiers. The patient was at home and in [place] and I was at home in [place].  We spent 10 minutes in the above mentioned way with discussion and plan formation.   I discussed the limitations of evaluation and management by telemedicine. The patient expressed understanding and agreed to proceed.  [place], DO

## 2021-09-29 NOTE — H&P (View-Only) (Signed)
   Patient ID: Shelly Hensley, female    DOB: 03/24/1981, 41 y.o.   MRN: [id number]   Chief Complaint  Patient presents with   Breast Cancer    The patient is a 41-year-old female joining me by telemetry visit with her husband for consultation for breast reconstruction.  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  She also has the CHEK 2 mutation.  She is otherwise in good health.  She is seeing Dr. Morgan again.  She has not had previous breast surgery.  She is 5 feet 2 inches tall and weighs 139 pounds.  Her preoperative bra size is a C.  She would like to be around the same cup size or slightly smaller.  She has grade 2-3 ptosis at present.  The biopsy site came from the left breast inferior to the areola.  She had previously discussed reconstruction with another surgeon in the practice.  They had initially decided on prepec implant placement.  Today we discussed all the options.     Review of Systems  Constitutional: Negative.   HENT: Negative.    Eyes: Negative.   Respiratory: Negative.    Cardiovascular: Negative.   Gastrointestinal: Negative.   Endocrine: Negative.   Genitourinary: Negative.   Neurological: Negative.   Hematological: Negative.     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  No other pathogenic variants detected in [facility] Panel.  Report date is Jul 23, 2021.   The BRCAplus panel offered by [facility] and includes sequencing and deletion/duplication analysis for the following 8 genes: ATM, BRCA1, BRCA2, CDH1, CHEK2, PALB2, PTEN, and TP53.  Results of pan-cancer panel are pending.    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    History reviewed. No pertinent surgical history.     Current Outpatient Medications:    Cholecalciferol 100 MCG (4000 UT) CAPS, Take 1 tablet by mouth daily., Disp: , Rfl:    ondansetron (ZOFRAN) 4 MG tablet, Take 1 tablet (4 mg total) by mouth every 8 (eight) hours as needed for nausea or vomiting., Disp: 20 tablet, Rfl: 0   OVER THE COUNTER MEDICATION, Juice Plus-Taking daily., Disp: , Rfl:    oxyCODONE (OXY IR/ROXICODONE) 5 MG immediate release tablet, Take 1 tablet (5 mg total) by mouth every 6 (six) hours as needed for up to 5 days for severe pain., Disp: 20 tablet, Rfl: 0   sulfamethoxazole-trimethoprim (BACTRIM DS) 800-160 MG tablet, Take 1 tablet by mouth 2 (two) times daily for 14 days., Disp: 28 tablet, Rfl: 0   Objective:   There were no vitals filed for this visit.  Physical Exam  Assessment & Plan:  Ductal carcinoma in situ (DCIS) of left breast  Genetic testing  Monoallelic mutation of CHEK2 gene in female patient  We had a great conversation and discussed the options for putting the expander above or below the muscle.  The patient has decided she would like to have the expander placed under the muscle.  We will move forward for Tuesday surgery with Dr. Morgan with immediate breast reconstruction with expander and Flex HD placement.  I connected with  Earnestene A Alden on 09/29/21 by a video enabled telemedicine application and verified that I am   speaking with the correct person using two identifiers. The patient was at home and in [place] and I was at home in .  We spent 10 minutes in the above mentioned way with discussion and plan formation.   I discussed the limitations of evaluation and management by telemedicine. The patient expressed understanding and agreed to proceed.  Zadkiel Dragan S Aprile Dickenson, DO 

## 2021-10-02 ENCOUNTER — Other Ambulatory Visit: Payer: Self-pay

## 2021-10-02 ENCOUNTER — Telehealth: Payer: Self-pay | Admitting: Plastic Surgery

## 2021-10-02 ENCOUNTER — Ambulatory Visit (HOSPITAL_BASED_OUTPATIENT_CLINIC_OR_DEPARTMENT_OTHER): Payer: Commercial Managed Care - PPO | Admitting: Certified Registered"

## 2021-10-02 ENCOUNTER — Observation Stay (HOSPITAL_BASED_OUTPATIENT_CLINIC_OR_DEPARTMENT_OTHER)
Admission: RE | Admit: 2021-10-02 | Discharge: 2021-10-03 | Disposition: A | Discharge status: Home / Self Care | Payer: Commercial Managed Care - PPO | Attending: Plastic Surgery | Admitting: Plastic Surgery

## 2021-10-02 ENCOUNTER — Encounter (HOSPITAL_BASED_OUTPATIENT_CLINIC_OR_DEPARTMENT_OTHER)
Admission: RE | Disposition: A | Discharge status: Home / Self Care | Payer: Self-pay | Source: Home / Self Care | Attending: Plastic Surgery

## 2021-10-02 ENCOUNTER — Encounter (HOSPITAL_BASED_OUTPATIENT_CLINIC_OR_DEPARTMENT_OTHER): Payer: Self-pay | Admitting: Plastic Surgery

## 2021-10-02 DIAGNOSIS — C50512 Malignant neoplasm of lower-outer quadrant of left female breast: Secondary | ICD-10-CM

## 2021-10-02 DIAGNOSIS — D0512 Intraductal carcinoma in situ of left breast: Secondary | ICD-10-CM | POA: Diagnosis present

## 2021-10-02 DIAGNOSIS — Z1501 Genetic susceptibility to malignant neoplasm of breast: Secondary | ICD-10-CM | POA: Diagnosis not present

## 2021-10-02 DIAGNOSIS — C50919 Malignant neoplasm of unspecified site of unspecified female breast: Secondary | ICD-10-CM | POA: Diagnosis present

## 2021-10-02 DIAGNOSIS — N6011 Diffuse cystic mastopathy of right breast: Secondary | ICD-10-CM | POA: Insufficient documentation

## 2021-10-02 DIAGNOSIS — Z421 Encounter for breast reconstruction following mastectomy: Secondary | ICD-10-CM

## 2021-10-02 HISTORY — PX: BREAST RECONSTRUCTION WITH PLACEMENT OF TISSUE EXPANDER AND FLEX HD (ACELLULAR HYDRATED DERMIS): SHX6295

## 2021-10-02 HISTORY — PX: SIMPLE MASTECTOMY WITH AXILLARY SENTINEL NODE BIOPSY: SHX6098

## 2021-10-02 LAB — POCT PREGNANCY, URINE: Preg Test, Ur: NEGATIVE

## 2021-10-02 SURGERY — BREAST RECONSTRUCTION WITH PLACEMENT OF TISSUE EXPANDER AND FLEX HD (ACELLULAR HYDRATED DERMIS)
Anesthesia: General | Site: Breast | Laterality: Bilateral

## 2021-10-02 MED ORDER — KETOROLAC TROMETHAMINE 30 MG/ML IJ SOLN
30.0000 mg | Freq: Once | INTRAMUSCULAR | Status: AC | PRN
  Administered 2021-10-02: 30 mg via INTRAVENOUS

## 2021-10-02 MED ORDER — EPHEDRINE SULFATE (PRESSORS) 50 MG/ML IJ SOLN
INTRAMUSCULAR | Status: DC | PRN
  Administered 2021-10-02: 5 mg via INTRAVENOUS
  Administered 2021-10-02: 10 mg via INTRAVENOUS
  Administered 2021-10-02: 5 mg via INTRAVENOUS

## 2021-10-02 MED ORDER — MELATONIN 3 MG PO TABS: 3.0000 mg | ORAL_TABLET | Freq: Every evening | ORAL | Status: DC | PRN

## 2021-10-02 MED ORDER — CEFAZOLIN SODIUM-DEXTROSE 2-4 GM/100ML-% IV SOLN
INTRAVENOUS | Status: AC
  Filled 2021-10-02: qty 100

## 2021-10-02 MED ORDER — ACETAMINOPHEN 325 MG PO TABS: 325.0000 mg | ORAL_TABLET | Freq: Four times a day (QID) | ORAL | Status: DC

## 2021-10-02 MED ORDER — FENTANYL CITRATE (PF) 100 MCG/2ML IJ SOLN
INTRAMUSCULAR | Status: AC
  Filled 2021-10-02: qty 2

## 2021-10-02 MED ORDER — HYDROMORPHONE HCL 1 MG/ML IJ SOLN
INTRAMUSCULAR | Status: AC
  Filled 2021-10-02: qty 0.5

## 2021-10-02 MED ORDER — CLONIDINE HCL (ANALGESIA) 100 MCG/ML EP SOLN
EPIDURAL | Status: DC | PRN
  Administered 2021-10-02 (×2): 50 ug

## 2021-10-02 MED ORDER — FENTANYL CITRATE (PF) 100 MCG/2ML IJ SOLN
100.0000 ug | Freq: Once | INTRAMUSCULAR | Status: AC
  Administered 2021-10-02: 100 ug via INTRAVENOUS

## 2021-10-02 MED ORDER — CEFAZOLIN SODIUM-DEXTROSE 2-4 GM/100ML-% IV SOLN
2.0000 g | INTRAVENOUS | Status: AC
  Administered 2021-10-02: 2 g via INTRAVENOUS

## 2021-10-02 MED ORDER — PHENYLEPHRINE HCL (PRESSORS) 10 MG/ML IV SOLN
INTRAVENOUS | Status: DC | PRN
  Administered 2021-10-02: 160 ug via INTRAVENOUS

## 2021-10-02 MED ORDER — ONDANSETRON HCL 4 MG/2ML IJ SOLN: 4.0000 mg | Freq: Four times a day (QID) | INTRAMUSCULAR | Status: DC | PRN

## 2021-10-02 MED ORDER — CEFAZOLIN SODIUM-DEXTROSE 2-4 GM/100ML-% IV SOLN
2.0000 g | Freq: Three times a day (TID) | INTRAVENOUS | Status: DC
  Administered 2021-10-02 – 2021-10-03 (×3): 2 g via INTRAVENOUS
  Filled 2021-10-02 (×3): qty 100

## 2021-10-02 MED ORDER — HYDROMORPHONE HCL 1 MG/ML IJ SOLN: 1.0000 mg | INTRAMUSCULAR | Status: DC | PRN

## 2021-10-02 MED ORDER — IBUPROFEN 200 MG PO TABS
400.0000 mg | ORAL_TABLET | Freq: Four times a day (QID) | ORAL | Status: DC
Start: 2021-10-02 — End: 2021-10-03
  Administered 2021-10-02 – 2021-10-03 (×2): 400 mg via ORAL
  Filled 2021-10-02 (×2): qty 2

## 2021-10-02 MED ORDER — ONDANSETRON HCL 4 MG/2ML IJ SOLN
4.0000 mg | Freq: Once | INTRAMUSCULAR | Status: AC | PRN
  Administered 2021-10-02: 4 mg via INTRAVENOUS

## 2021-10-02 MED ORDER — OXYCODONE HCL 5 MG PO TABS
5.0000 mg | ORAL_TABLET | ORAL | Status: DC | PRN
  Administered 2021-10-02: 10 mg via ORAL
  Administered 2021-10-03: 5 mg via ORAL
  Filled 2021-10-02: qty 1
  Filled 2021-10-02: qty 2

## 2021-10-02 MED ORDER — KETOROLAC TROMETHAMINE 30 MG/ML IJ SOLN
INTRAMUSCULAR | Status: AC
  Filled 2021-10-02: qty 1

## 2021-10-02 MED ORDER — DIPHENHYDRAMINE HCL 12.5 MG/5ML PO ELIX: 12.5000 mg | ORAL_SOLUTION | Freq: Four times a day (QID) | ORAL | Status: DC | PRN

## 2021-10-02 MED ORDER — MIDAZOLAM HCL 2 MG/2ML IJ SOLN
INTRAMUSCULAR | Status: AC
  Filled 2021-10-02: qty 2

## 2021-10-02 MED ORDER — 0.9 % SODIUM CHLORIDE (POUR BTL) OPTIME
TOPICAL | Status: DC | PRN
  Administered 2021-10-02: 1000 mL

## 2021-10-02 MED ORDER — BUPIVACAINE HCL (PF) 0.25 % IJ SOLN
INTRAMUSCULAR | Status: DC | PRN
  Administered 2021-10-02 (×12): 5 mL via PERINEURAL

## 2021-10-02 MED ORDER — SODIUM CHLORIDE (PF) 0.9 % IJ SOLN
INTRAMUSCULAR | Status: DC | PRN
  Administered 2021-10-02: 1000 mL

## 2021-10-02 MED ORDER — MEPERIDINE HCL 25 MG/ML IJ SOLN: 6.2500 mg | INTRAMUSCULAR | Status: DC | PRN

## 2021-10-02 MED ORDER — KCL IN DEXTROSE-NACL 20-5-0.45 MEQ/L-%-% IV SOLN
INTRAVENOUS | Status: DC
  Filled 2021-10-02: qty 1000

## 2021-10-02 MED ORDER — SCOPOLAMINE 1 MG/3DAYS TD PT72
MEDICATED_PATCH | TRANSDERMAL | Status: AC
  Filled 2021-10-02: qty 1

## 2021-10-02 MED ORDER — HYDROMORPHONE HCL 1 MG/ML IJ SOLN
0.2500 mg | INTRAMUSCULAR | Status: DC | PRN
  Administered 2021-10-02 (×3): 0.5 mg via INTRAVENOUS

## 2021-10-02 MED ORDER — EPHEDRINE 5 MG/ML INJ
INTRAVENOUS | Status: AC
  Filled 2021-10-02: qty 5

## 2021-10-02 MED ORDER — DEXAMETHASONE SODIUM PHOSPHATE 10 MG/ML IJ SOLN
INTRAMUSCULAR | Status: DC | PRN
  Administered 2021-10-02 (×2): 10 mg

## 2021-10-02 MED ORDER — SENNA 8.6 MG PO TABS
1.0000 | ORAL_TABLET | Freq: Two times a day (BID) | ORAL | Status: DC
Start: 2021-10-02 — End: 2021-10-03
  Administered 2021-10-02: 8.6 mg via ORAL
  Filled 2021-10-02 (×2): qty 1

## 2021-10-02 MED ORDER — BISACODYL 10 MG RE SUPP: 10.0000 mg | Freq: Every day | RECTAL | Status: DC | PRN

## 2021-10-02 MED ORDER — SODIUM CHLORIDE 0.9 % IV SOLN
INTRAVENOUS | Status: AC
  Filled 2021-10-02: qty 10

## 2021-10-02 MED ORDER — LACTATED RINGERS IV SOLN: INTRAVENOUS | Status: DC

## 2021-10-02 MED ORDER — DIAZEPAM 2 MG PO TABS: 2.0000 mg | ORAL_TABLET | Freq: Two times a day (BID) | ORAL | Status: DC | PRN

## 2021-10-02 MED ORDER — MIDAZOLAM HCL 2 MG/2ML IJ SOLN
2.0000 mg | Freq: Once | INTRAMUSCULAR | Status: AC
  Administered 2021-10-02: 2 mg via INTRAVENOUS

## 2021-10-02 MED ORDER — FENTANYL CITRATE (PF) 100 MCG/2ML IJ SOLN
INTRAMUSCULAR | Status: DC | PRN
  Administered 2021-10-02 (×4): 25 ug via INTRAVENOUS

## 2021-10-02 MED ORDER — LIDOCAINE HCL (CARDIAC) PF 100 MG/5ML IV SOSY
PREFILLED_SYRINGE | INTRAVENOUS | Status: DC | PRN
  Administered 2021-10-02: 100 mg via INTRAVENOUS

## 2021-10-02 MED ORDER — PROPOFOL 10 MG/ML IV BOLUS
INTRAVENOUS | Status: DC | PRN
  Administered 2021-10-02: 150 mg via INTRAVENOUS

## 2021-10-02 MED ORDER — ONDANSETRON HCL 4 MG/2ML IJ SOLN
INTRAMUSCULAR | Status: AC
  Filled 2021-10-02: qty 2

## 2021-10-02 MED ORDER — SCOPOLAMINE 1 MG/3DAYS TD PT72
1.0000 | MEDICATED_PATCH | TRANSDERMAL | Status: DC
  Administered 2021-10-02: 1.5 mg via TRANSDERMAL

## 2021-10-02 MED ORDER — DEXAMETHASONE SODIUM PHOSPHATE 10 MG/ML IJ SOLN
INTRAMUSCULAR | Status: DC | PRN
  Administered 2021-10-02: 10 mg via INTRAVENOUS

## 2021-10-02 MED ORDER — LIDOCAINE 2% (20 MG/ML) 5 ML SYRINGE
INTRAMUSCULAR | Status: AC
  Filled 2021-10-02: qty 5

## 2021-10-02 MED ORDER — DIPHENHYDRAMINE HCL 50 MG/ML IJ SOLN: 12.5000 mg | Freq: Four times a day (QID) | INTRAMUSCULAR | Status: DC | PRN

## 2021-10-02 MED ORDER — PROPOFOL 10 MG/ML IV BOLUS
INTRAVENOUS | Status: AC
  Filled 2021-10-02: qty 20

## 2021-10-02 MED ORDER — POLYETHYLENE GLYCOL 3350 17 G PO PACK: 17.0000 g | PACK | Freq: Every day | ORAL | Status: DC | PRN

## 2021-10-02 MED ORDER — PROMETHAZINE HCL 25 MG/ML IJ SOLN
12.5000 mg | Freq: Once | INTRAMUSCULAR | Status: AC
  Administered 2021-10-02: 12.5 mg via INTRAVENOUS

## 2021-10-02 MED ORDER — DEXAMETHASONE SODIUM PHOSPHATE 10 MG/ML IJ SOLN
INTRAMUSCULAR | Status: AC
  Filled 2021-10-02: qty 1

## 2021-10-02 MED ORDER — SODIUM CHLORIDE 0.9 % IV SOLN
INTRAVENOUS | Status: DC | PRN
  Administered 2021-10-02: 500 mL

## 2021-10-02 MED ORDER — CHLORHEXIDINE GLUCONATE CLOTH 2 % EX PADS: 6.0000 | MEDICATED_PAD | Freq: Once | CUTANEOUS | Status: DC

## 2021-10-02 MED ORDER — ONDANSETRON 4 MG PO TBDP: 4.0000 mg | ORAL_TABLET | Freq: Four times a day (QID) | ORAL | Status: DC | PRN

## 2021-10-02 MED ORDER — ONDANSETRON HCL 4 MG/2ML IJ SOLN
INTRAMUSCULAR | Status: DC | PRN
  Administered 2021-10-02: 4 mg via INTRAVENOUS

## 2021-10-02 MED ORDER — PROMETHAZINE HCL 25 MG/ML IJ SOLN
INTRAMUSCULAR | Status: AC
  Filled 2021-10-02: qty 1

## 2021-10-02 SURGICAL SUPPLY — 99 items
ADH SKN CLS APL DERMABOND .7 (GAUZE/BANDAGES/DRESSINGS) ×2
APL PRP STRL LF DISP 70% ISPRP (MISCELLANEOUS)
APL SKNCLS STERI-STRIP NONHPOA (GAUZE/BANDAGES/DRESSINGS)
BAG DECANTER FOR FLEXI CONT (MISCELLANEOUS) ×2 IMPLANT
BENZOIN TINCTURE PRP APPL 2/3 (GAUZE/BANDAGES/DRESSINGS) ×1 IMPLANT
BINDER BREAST LRG (GAUZE/BANDAGES/DRESSINGS) ×1 IMPLANT
BINDER BREAST MEDIUM (GAUZE/BANDAGES/DRESSINGS) IMPLANT
BINDER BREAST XLRG (GAUZE/BANDAGES/DRESSINGS) IMPLANT
BINDER BREAST XXLRG (GAUZE/BANDAGES/DRESSINGS) IMPLANT
BIOPATCH RED 1 DISK 7.0 (GAUZE/BANDAGES/DRESSINGS) ×3 IMPLANT
BLADE CLIPPER SURG (BLADE) IMPLANT
BLADE HEX COATED 2.75 (ELECTRODE) ×2 IMPLANT
BLADE SURG 10 STRL SS (BLADE) ×2 IMPLANT
BLADE SURG 15 STRL LF DISP TIS (BLADE) ×1 IMPLANT
BLADE SURG 15 STRL SS (BLADE) ×4
BNDG ELASTIC 6X5.8 VLCR STR LF (GAUZE/BANDAGES/DRESSINGS) IMPLANT
BNDG GAUZE DERMACEA FLUFF (GAUZE/BANDAGES/DRESSINGS)
BNDG GAUZE DERMACEA FLUFF 4 (GAUZE/BANDAGES/DRESSINGS) ×2 IMPLANT
BNDG GZE DERMACEA 4 6PLY (GAUZE/BANDAGES/DRESSINGS)
CANISTER SUCT 1200ML W/VALVE (MISCELLANEOUS) ×2 IMPLANT
CHLORAPREP W/TINT 26 (MISCELLANEOUS) IMPLANT
COVER BACK TABLE 60X90IN (DRAPES) ×2 IMPLANT
COVER MAYO STAND STRL (DRAPES) ×3 IMPLANT
DERMABOND ADVANCED (GAUZE/BANDAGES/DRESSINGS) ×2
DERMABOND ADVANCED .7 DNX12 (GAUZE/BANDAGES/DRESSINGS) ×2 IMPLANT
DRAIN CHANNEL 19F RND (DRAIN) ×2 IMPLANT
DRAPE LAPAROSCOPIC ABDOMINAL (DRAPES) ×2 IMPLANT
DRAPE UTILITY XL STRL (DRAPES) ×2 IMPLANT
DRSG OPSITE POSTOP 4X6 (GAUZE/BANDAGES/DRESSINGS) ×2 IMPLANT
DRSG PAD ABDOMINAL 8X10 ST (GAUZE/BANDAGES/DRESSINGS) ×4 IMPLANT
ELECT BLADE 4.0 EZ CLEAN MEGAD (MISCELLANEOUS) ×2
ELECT COATED BLADE 2.86 ST (ELECTRODE) ×2 IMPLANT
ELECT REM PT RETURN 9FT ADLT (ELECTROSURGICAL) ×4
ELECTRODE BLDE 4.0 EZ CLN MEGD (MISCELLANEOUS) ×1 IMPLANT
ELECTRODE REM PT RTRN 9FT ADLT (ELECTROSURGICAL) ×1 IMPLANT
EVACUATOR SILICONE 100CC (DRAIN) ×2 IMPLANT
FUNNEL KELLER 2 DISP (MISCELLANEOUS) IMPLANT
GAUZE SPONGE 4X4 12PLY STRL LF (GAUZE/BANDAGES/DRESSINGS) IMPLANT
GLOVE BIO SURGEON STRL SZ 6.5 (GLOVE) ×7 IMPLANT
GLOVE BIO SURGEON STRL SZ7 (GLOVE) ×3 IMPLANT
GLOVE BIOGEL PI IND STRL 7.0 (GLOVE) IMPLANT
GLOVE BIOGEL PI IND STRL 7.5 (GLOVE) ×1 IMPLANT
GLOVE BIOGEL PI INDICATOR 7.0 (GLOVE) ×2
GLOVE BIOGEL PI INDICATOR 7.5 (GLOVE) ×1
GOWN STRL REUS W/ TWL LRG LVL3 (GOWN DISPOSABLE) ×3 IMPLANT
GOWN STRL REUS W/TWL LRG LVL3 (GOWN DISPOSABLE) ×10
GRAFT FLEX HD 6X16 PLIABLE (Tissue) ×2 IMPLANT
HEMOSTAT ARISTA ABSORB 3G PWDR (HEMOSTASIS) IMPLANT
IMPL EXPANDER BREAST 455CC (Breast) IMPLANT
IMPLANT BREAST 455CC (Breast) ×2 IMPLANT
IMPLANT EXPANDER BREAST 455CC (Breast) ×2 IMPLANT
IV NS 1000ML (IV SOLUTION) ×2
IV NS 1000ML BAXH (IV SOLUTION) IMPLANT
IV NS 500ML (IV SOLUTION)
IV NS 500ML BAXH (IV SOLUTION) ×1 IMPLANT
KIT FILL ASEPTIC TRANSFER (MISCELLANEOUS) ×1 IMPLANT
NDL HYPO 25X1 1.5 SAFETY (NEEDLE) IMPLANT
NDL SAFETY ECLIPSE 18X1.5 (NEEDLE) IMPLANT
NEEDLE HYPO 18GX1.5 SHARP (NEEDLE)
NEEDLE HYPO 25X1 1.5 SAFETY (NEEDLE) ×2 IMPLANT
NS IRRIG 1000ML POUR BTL (IV SOLUTION) ×2 IMPLANT
PACK BASIN DAY SURGERY FS (CUSTOM PROCEDURE TRAY) ×2 IMPLANT
PAD FOAM SILICONE BACKED (GAUZE/BANDAGES/DRESSINGS) IMPLANT
PENCIL SMOKE EVACUATOR (MISCELLANEOUS) ×3 IMPLANT
PIN SAFETY STERILE (MISCELLANEOUS) ×2 IMPLANT
SHEET MEDIUM DRAPE 40X70 STRL (DRAPES) ×2 IMPLANT
SLEEVE SCD COMPRESS KNEE MED (STOCKING) ×2 IMPLANT
SPIKE FLUID TRANSFER (MISCELLANEOUS) ×1 IMPLANT
SPONGE T-LAP 18X18 ~~LOC~~+RFID (SPONGE) ×5 IMPLANT
STAPLER VISISTAT 35W (STAPLE) IMPLANT
STRIP CLOSURE SKIN 1/2X4 (GAUZE/BANDAGES/DRESSINGS) ×3 IMPLANT
STRIP SUTURE WOUND CLOSURE 1/2 (MISCELLANEOUS) IMPLANT
SUT ETHILON 2 0 FS 18 (SUTURE) ×1 IMPLANT
SUT MNCRL AB 4-0 PS2 18 (SUTURE) ×3 IMPLANT
SUT MON AB 3-0 SH 27 (SUTURE) ×4
SUT MON AB 3-0 SH27 (SUTURE) ×1 IMPLANT
SUT MON AB 5-0 PS2 18 (SUTURE) IMPLANT
SUT PDS 3-0 CT2 (SUTURE) ×4
SUT PDS AB 2-0 CT2 27 (SUTURE) ×8 IMPLANT
SUT PDS II 3-0 CT2 27 ABS (SUTURE) IMPLANT
SUT PROLENE 3 0 PS 1 (SUTURE) IMPLANT
SUT SILK 0 TIES 10X30 (SUTURE) IMPLANT
SUT SILK 2 0 PERMA HAND 18 BK (SUTURE) ×1 IMPLANT
SUT SILK 2 0 SH (SUTURE) ×1 IMPLANT
SUT SILK 3 0 PS 1 (SUTURE) ×3 IMPLANT
SUT VIC AB 2-0 CT1 27 (SUTURE)
SUT VIC AB 2-0 CT1 TAPERPNT 27 (SUTURE) IMPLANT
SUT VIC AB 3-0 54X BRD REEL (SUTURE) IMPLANT
SUT VIC AB 3-0 BRD 54 (SUTURE)
SUT VIC AB 3-0 SH 27 (SUTURE)
SUT VIC AB 3-0 SH 27X BRD (SUTURE) IMPLANT
SUT VICRYL 3-0 CR8 SH (SUTURE) ×1 IMPLANT
SYR BULB IRRIG 60ML STRL (SYRINGE) ×3 IMPLANT
SYR CONTROL 10ML LL (SYRINGE) ×1 IMPLANT
TOWEL GREEN STERILE FF (TOWEL DISPOSABLE) ×4 IMPLANT
TRAY DSU PREP LF (CUSTOM PROCEDURE TRAY) ×2 IMPLANT
TUBE CONNECTING 20X1/4 (TUBING) ×2 IMPLANT
UNDERPAD 30X36 HEAVY ABSORB (UNDERPADS AND DIAPERS) ×4 IMPLANT
YANKAUER SUCT BULB TIP NO VENT (SUCTIONS) ×2 IMPLANT

## 2021-10-02 NOTE — Discharge Instructions (Signed)
INSTRUCTIONS FOR AFTER BREAST SURGERY   You will likely have some questions about what to expect following your operation.  The following information will help you and your family understand what to expect when you are discharged from the hospital.  Following these guidelines will help ensure a smooth recovery and reduce risks of complications.  Postoperative instructions include information on: diet, wound care, medications and physical activity.  AFTER SURGERY Expect to go home after the procedure.  In some cases, you may need to spend one night in the hospital for observation.  You will follow up with Dr. Marla Roe  in the clinic in 7-10 days.   DIET Breast surgery does not require a specific diet.  However, the healthier you eat the better your body can start healing. It is important to increasing your protein intake.  This means limiting the foods with sugar and carbohydrates.  Focus on vegetables and some meat.  If you have any liposuction during your procedure be sure to drink water.  If your urine is bright yellow, then it is concentrated, and you need to drink more water.  As a general rule after surgery, you should have 8 ounces of water every hour while awake.  If you find you are persistently nauseated or unable to take in liquids let us know.  NO TOBACCO USE or EXPOSURE.  This will slow your healing process and increase the risk of a wound.  WOUND CARE Leave the ACE wrap or binder on for 3 days . Use fragrance free soap.   After 3 days you can remove the ACE wrap or binder to shower. Once dry apply ACE wrap, binder or sports bra.  Use a mild soap like Dial, Dove and [place]. You may have Topifoam or Lipofoam on.  It is soft and spongy and helps keep you from getting creases if you have liposuction.  This can be removed before the shower and then replaced.  If you need more it is available on [employer] (Lipofoam). If you have steri-strips / tape directly attached to your skin leave them in  place. It is OK to get these wet.   No baths, pools or hot tubs for four weeks. We close your incision to leave the smallest and best-looking scar. No ointment or creams on your incisions until given the go ahead.  Especially not Neosporin (Too many skin reactions with this one).  A few weeks after surgery you can use Mederma and start massaging the scar. We ask you to wear your binder or sports bra for the first 6 weeks around the clock, including while sleeping. This provides added comfort and helps reduce the fluid accumulation at the surgery site.  ACTIVITY No heavy lifting until cleared by the doctor.  This usually means no more than a half-gallon of milk.  It is OK to walk and climb stairs. In fact, moving your legs is very important to decrease your risk of a blood clot.  It will also help keep you from getting deconditioned.  Every 1 to 2 hours get up and walk for 5 minutes. This will help with a quicker recovery back to normal.  Let pain be your guide so you don't do too much.  This is not the time for spring cleaning and don't plan on taking care of anyone else.  This time is for you to recover,  You will be more comfortable if you sleep and rest with your head elevated either with a few pillows under  you or in a recliner.  No stomach sleeping for a three months.  WORK Everyone returns to work at different times. As a rough guide, most people take at least 1 - 2 weeks off prior to returning to work. If you need documentation for your job, bring the forms to your postoperative follow up visit.  DRIVING Arrange for someone to bring you home from the hospital.  You may be able to drive a few days after surgery but not while taking any narcotics or valium.  BOWEL MOVEMENTS Constipation can occur after anesthesia and while taking pain medication.  It is important to stay ahead for your comfort.  We recommend taking Milk of Magnesia (2 tablespoons; twice a day) while taking the pain  pills.  MEDICATIONS You may be prescribed should start after surgery At your preoperative visit for you history and physical you may have been given the following medications: An antibiotic: Start this medication when you get home and take according to the instructions on the bottle. Zofran 4 mg:  This is to treat nausea and vomiting.  You can take this every 6 hours as needed and only if needed. Valium 2 mg: This is for muscle tightness if you have an implant or expander. This will help relax your muscle which also helps with pain control.  This can be taken every 12 hours as needed. Don't drive after taking this medication. Norco (hydrocodone/acetaminophen) 5/325 mg:  This is only to be used after you have taken the motrin or the tylenol. Every 8 hours as needed.   Over the counter Medication to take: Ibuprofen (Motrin) 600 mg:  Take this every 6 hours.  If you have additional pain then take 500 mg of the tylenol every 8 hours.  Only take the Norco after you have tried these two. Miralax or stool softener of choice: Take this according to the bottle if you take the [place] Call your surgeon's office if any of the following occur: Fever 101 degrees F or greater Excessive bleeding or fluid from the incision site. Pain that increases over time without aid from the medications Redness, warmth, or pus draining from incision sites Persistent nausea or inability to take in liquids Severe misshapen area that underwent the operation.  Here are some resources:  Plastic surgery website: https://www.plasticsurgery.org/for-medical-professionals/education-and-resources/publications/breast-reconstruction-magazine Breast Reconstruction Awareness Campaign:  [employer] Plastic surgery Implant information:  https://www.plasticsurgery.org/patient-safety/breast-implant-safety    About my Jackson-Pratt Bulb Drain  What is a Jackson-Pratt bulb? A Jackson-Pratt is a soft,  round device used to collect drainage. It is connected to a long, thin drainage catheter, which is held in place by one or two small stiches near your surgical incision site. When the bulb is squeezed, it forms a vacuum, forcing the drainage to empty into the bulb.  Emptying the Jackson-Pratt bulb- To empty the bulb: 1. Release the plug on the top of the bulb. 2. Pour the bulb's contents into a measuring container which your nurse will provide. 3. Record the time emptied and amount of drainage. Empty the drain(s) as often as your     doctor or nurse recommends.  Date                  Time                    Amount (Drain 1)                 Amount (Drain 2)  _____________________________________________________________________  _____________________________________________________________________  _____________________________________________________________________  _____________________________________________________________________  _____________________________________________________________________  _____________________________________________________________________  _____________________________________________________________________  _____________________________________________________________________  Squeezing the Jackson-Pratt Bulb- To squeeze the bulb: 1. Make sure the plug at the top of the bulb is open. 2. Squeeze the bulb tightly in your fist. You will hear air squeezing from the bulb. 3. Replace the plug while the bulb is squeezed. 4. Use a safety pin to attach the bulb to your clothing. This will keep the catheter from     pulling at the bulb insertion site.  When to call your doctor- Call your doctor if: Drain site becomes red, swollen or hot. You have a fever greater than 101 degrees F. There is oozing at the drain site. Drain falls out (apply a guaze bandage over the drain hole and secure it with tape). Drainage increases daily not related to activity  patterns. (You will usually have more drainage when you are active than when you are resting.) Drainage has a bad odor.  Regional Anesthesia Blocks  1. Numbness or the inability to move the "blocked" extremity may last from 3-48 hours after placement. The length of time depends on the medication injected and your individual response to the medication. If the numbness is not going away after 48 hours, call your surgeon.  2. The extremity that is blocked will need to be protected until the numbness is gone and the  Strength has returned. Because you cannot feel it, you will need to take extra care to avoid injury. Because it may be weak, you may have difficulty moving it or using it. You may not know what position it is in without looking at it while the block is in effect.  3. For blocks in the legs and feet, returning to weight bearing and walking needs to be done carefully. You will need to wait until the numbness is entirely gone and the strength has returned. You should be able to move your leg and foot normally before you try and bear weight or walk. You will need someone to be with you when you first try to ensure you do not fall and possibly risk injury.  4. Bruising and tenderness at the needle site are common side effects and will resolve in a few days.  5. Persistent numbness or new problems with movement should be communicated to the surgeon or the [facility] [phone] [place] [phone]).

## 2021-10-02 NOTE — Interval H&P Note (Signed)
History and Physical Interval Note:  10/02/2021 9:33 AM  Shelly Hensley  has presented today for surgery, with the diagnosis of Ductal carcinoma in situ of left breast.  The various methods of treatment have been discussed with the patient and family. After consideration of risks, benefits and other options for treatment, the patient has consented to  Procedure(s) with comments: BREAST RECONSTRUCTION WITH PLACEMENT OF TISSUE EXPANDER AND FLEX HD (ACELLULAR HYDRATED DERMIS) (Bilateral) - 2 hours SIMPLE MASTECTOMY (Bilateral) as a surgical intervention.  The patient's history has been reviewed, patient examined, no change in status, stable for surgery.  I have reviewed the patient's chart and labs.  Questions were answered to the patient's satisfaction.     Loel Lofty Brinley Treanor

## 2021-10-02 NOTE — Anesthesia Procedure Notes (Signed)
Anesthesia Regional Block: Pectoralis block   Pre-Anesthetic Checklist: , timeout performed,  Correct Patient, Correct Site, Correct Laterality,  Correct Procedure, Correct Position, site marked,  Risks and benefits discussed,  Surgical consent,  Pre-op evaluation,  At surgeon's request and post-op pain management  Laterality: Right and N/A  Prep: chloraprep       Needles:  Injection technique: Single-shot  Needle Type: Echogenic Stimulator Needle     Needle Length: 9cm  Needle Gauge: 20   Needle insertion depth: 1 cm   Additional Needles:   Procedures:,,,, ultrasound used (permanent image in chart),,    Narrative:  Start time: 10/02/2021 10:10 AM End time: 10/02/2021 10:19 AM Injection made incrementally with aspirations every 5 mL.  Performed by: Personally  Anesthesiologist: Lyn Hollingshead, MD

## 2021-10-02 NOTE — Progress Notes (Signed)
Assisted Dr. Jillyn Hidden with left, right, pectoralis, ultrasound guided block. Side rails up, monitors on throughout procedure. See vital signs in flow sheet. Tolerated Procedure well.

## 2021-10-02 NOTE — Anesthesia Procedure Notes (Signed)
Anesthesia Regional Block: Pectoralis block   Pre-Anesthetic Checklist: , timeout performed,  Correct Patient, Correct Site, Correct Laterality,  Correct Procedure, Correct Position, site marked,  Risks and benefits discussed,  Surgical consent,  Pre-op evaluation,  At surgeon's request and post-op pain management  Laterality: Left and N/A  Prep: chloraprep       Needles:  Injection technique: Single-shot  Needle Type: Echogenic Stimulator Needle     Needle Length: 9cm  Needle Gauge: 21   Needle insertion depth: 1.5 cm   Additional Needles:   Procedures:,,,, ultrasound used (permanent image in chart),,    Narrative:  Start time: 10/02/2021 10:22 AM End time: 10/02/2021 10:30 AM Injection made incrementally with aspirations every 5 mL.  Performed by: Personally  Anesthesiologist: Lyn Hollingshead, MD

## 2021-10-02 NOTE — Op Note (Addendum)
Op report    DATE OF OPERATION:  10/02/2021  LOCATION: [facility]  SURGICAL DIVISION: Plastic Surgery  PREOPERATIVE DIAGNOSES:  1. Left Breast cancer lower outer quadrant DCIS.    POSTOPERATIVE DIAGNOSES:  Same a preop diagnosis  PROCEDURE:  1. Bilateral immediate breast reconstruction with placement of Acellular Dermal Matrix and tissue expanders.  SURGEON: Cecia Egge Sanger Ivianna Notch, DO  ASSISTANT: Roetta Sessions, PA  ANESTHESIA:  General.   COMPLICATIONS: None.   IMPLANTS: Left - Mentor 455 cc. Ref #[id number].  Serial Number [id number], 150 cc of injectable saline placed in the expander. Right - Mentor 455 cc. Ref #[id number].  Serial Number [id number], 150 cc of injectable saline placed in the expander. Acellular Dermal Matrix 6 x 16 cm Flex HD x 2  INDICATIONS FOR PROCEDURE:  The patient, Shelly Hensley, is a 41 y.o. female born on [birth year], is here for  immediate first stage breast reconstruction with placement of bilateral tissue expander and Acellular dermal matrix. MRN: [id number]  CONSENT:  Informed consent was obtained directly from the patient. Risks, benefits and alternatives were fully discussed. Specific risks including but not limited to bleeding, infection, hematoma, seroma, scarring, pain, implant infection, implant extrusion, capsular contracture, asymmetry, wound healing problems, and need for further surgery were all discussed. The patient did have an ample opportunity to have her questions answered to her satisfaction.   DESCRIPTION OF PROCEDURE:  The patient was taken to the operating room by the general surgery team. SCDs were placed and IV antibiotics were given. The patient's chest was prepped and draped in a sterile fashion. A time out was performed and the implants to be used were identified.  Bilateral mastectomies were performed.  Once the general surgery team had completed their portion of the case the patient was rendered to  the plastic and reconstructive surgery team.  Right:  The pectoralis major muscle was lifted from the chest wall with release of the lateral edge and lateral inframammary fold.  The pocket was irrigated with antibiotic solution and hemostasis was achieved with electrocautery.  The ADM was then prepared according to the manufacture guidelines and slits placed to help with postoperative fluid management.  The ADM was then sutured to the inferior and lateral edge of the inframammary fold with 2-0 PDS starting with an interrupted stitch and then a running stitch.  The lateral portion was sutured to with interrupted sutures after the expander was placed.  The expander was prepared according to the manufacture guidelines, the air evacuated and then it was placed under the ADM and pectoralis major muscle.  The inferior and lateral tabs were used to secure the expander to the chest wall with 2-0 PDS.  The drain was placed at the inframammary fold over the ADM and secured to the skin with 3-0 Silk.  The deep layers were closed with 3-0 PDS followed by 3-0 Monocryl.  The skin was closed with 4-0 Monocryl and then dermabond was applied.    Left:  The pectoralis major muscle was lifted from the chest wall with release of the lateral edge and lateral inframammary fold.  The pocket was irrigated with antibiotic solution and hemostasis was achieved with electrocautery.  The ADM was then prepared according to the manufacture guidelines and slits placed to help with postoperative fluid management.  The ADM was then sutured to the inferior and lateral edge of the inframammary fold with 2-0 PDS starting with an interrupted stitch and then a running stitch.  The lateral portion  was sutured to with interrupted sutures after the expander was placed.  The expander was prepared according to the manufacture guidelines, the air evacuated and then it was placed under the ADM and pectoralis major muscle.  The inferior and lateral tabs were  used to secure the expander to the chest wall with 2-0 PDS.  The drain was placed at the inframammary fold over the ADM and secured to the skin with 3-0 Silk.  The deep layers were closed with 3-0 PDS followed by 3-0 Monocryl.  The skin was closed with 4-0 Monocryl and then dermabond was applied.  The ABDs and breast binder were placed.  The patient tolerated the procedure well and there were no complications.  The patient was allowed to wake from anesthesia and taken to the recovery room in satisfactory condition.   The advanced practice practitioner (APP) assisted throughout the case.  The APP was essential in retraction and counter traction when needed to make the case progress smoothly.  This retraction and assistance made it possible to see the tissue plans for the procedure.  The assistance was needed for blood control, tissue re-approximation and assisted with closure of the incision site.

## 2021-10-02 NOTE — Anesthesia Postprocedure Evaluation (Signed)
Anesthesia Post Note  Patient: Shelly Hensley  Procedure(s) Performed: BREAST RECONSTRUCTION WITH PLACEMENT OF TISSUE EXPANDER AND FLEX HD (ACELLULAR HYDRATED DERMIS) (Bilateral: Breast) SIMPLE MASTECTOMY (Bilateral: Breast)     Patient location during evaluation: PACU Anesthesia Type: General Level of consciousness: sedated Pain management: pain level controlled Vital Signs Assessment: post-procedure vital signs reviewed and stable Respiratory status: spontaneous breathing Cardiovascular status: stable Postop Assessment: no headache Anesthetic complications: no (PONV)   No notable events documented.  Last Vitals:  Vitals:   10/02/21 1345 10/02/21 1400  BP: 105/65 102/67  Pulse: 75 68  Resp: 19 10  Temp:    SpO2: 100% 94%    Last Pain:  Vitals:   10/02/21 1400  TempSrc:   PainSc: [place] Jr

## 2021-10-02 NOTE — Op Note (Signed)
Shelly Hensley 25-Jul-1980 [id number] 10/02/2021   Preoperative diagnosis: DCIS left breast, increased risk for breast cancer due to genetic mutation  Postoperative diagnosis: Same  Procedure: Bilateral simple mastectomy  Surgeon: Shelly Samples, MD  Assistant: Dr. Audelia Hives  Anesthesia: General   Clinical History and Indications: This patient was recently diagnosed with ductal carcinoma in situ of the left breast and subsequently found to have a Chek 2 mutation resulting in an increased risk of breast cancer.  She chose to proceed with bilateral mastectomy and immediate reconstruction.  Description of Procedure: The patient was met in the preoperative area and she and I both identified the operative sites.  She was taken to the operating room placed supine on the operating room table.  General anesthesia was induced.  Incision lines were marked in conjunction with Dr. Marla Roe.  I began on the right, which is the prophylactic side, an elliptical incision was made encompassing the nipple areolar complex.  Skin flaps were developed using electrocautery.  The breast was dissected from the chest wall using electrocautery.  The landmarks were preserved.  Hemostasis was assured.  The specimen was marked for orientation.  Next attention was turned to the left side where a symmetrical incision was created.  Skin flaps were developed with cautery and the breast was dissected from the chest wall using cautery in similar fashion.  Hemostasis was assured and the procedure was turned over to Dr. Marla Roe for reconstruction.  Shelly Hensley, III, MD, FACS 10/02/2021 11:51 AM

## 2021-10-02 NOTE — Interval H&P Note (Signed)
History and Physical Interval Note:  10/02/2021 9:33 AM  Shelly Hensley  has presented today for surgery, with the diagnosis of Ductal carcinoma in situ of left breast.  The various methods of treatment have been discussed with the patient and family. After consideration of risks, benefits and other options for treatment, the patient has consented to  Procedure(s) with comments: BREAST RECONSTRUCTION WITH PLACEMENT OF TISSUE EXPANDER AND FLEX HD (ACELLULAR HYDRATED DERMIS) (Bilateral) - 2 hours SIMPLE MASTECTOMY (Bilateral) as a surgical intervention.  The patient's history has been reviewed, patient examined, no change in status, stable for surgery.  I have reviewed the patient's chart and labs.  Questions were answered to the patient's satisfaction.     Loel Lofty Shelly Hensley

## 2021-10-02 NOTE — Anesthesia Procedure Notes (Signed)
Procedure Name: LMA Insertion Date/Time: 10/02/2021 10:55 AM  Performed by: Lavonia Dana, CRNAPre-anesthesia Checklist: Patient identified, Emergency Drugs available, Suction available and Patient being monitored Patient Re-evaluated:Patient Re-evaluated prior to induction Oxygen Delivery Method: Circle system utilized Preoxygenation: Pre-oxygenation with 100% oxygen Induction Type: IV induction Ventilation: Mask ventilation without difficulty LMA: LMA inserted LMA Size: 4.0 Number of attempts: 1 Airway Equipment and Method: Bite block Placement Confirmation: positive ETCO2 Tube secured with: Tape Dental Injury: Teeth and Oropharynx as per pre-operative assessment

## 2021-10-02 NOTE — Transfer of Care (Addendum)
Immediate Anesthesia Transfer of Care Note  Patient: [name]  Procedure(s) Performed: BREAST RECONSTRUCTION WITH PLACEMENT OF TISSUE EXPANDER AND FLEX HD (ACELLULAR HYDRATED DERMIS) (Bilateral: Breast) SIMPLE MASTECTOMY (Bilateral: Breast)  Patient Location: PACU  Anesthesia Type:GA combined with regional for post-op pain  Level of Consciousness: drowsy  Airway & Oxygen Therapy: Patient Spontanous Breathing and Patient connected to face mask oxygen  Post-op Assessment: Report given to RN and Post -op Vital signs reviewed and stable  Post vital signs: Reviewed and stable  Last Vitals:  Vitals Value Taken Time  BP 105/65 10/02/21 1345  Temp 36.3 C 10/02/21 1315  Pulse 73 10/02/21 1351  Resp 19 10/02/21 1351  SpO2 100 % 10/02/21 1351  Vitals shown include unvalidated device data.  Last Pain:  Vitals:   10/02/21 1315  TempSrc:   PainSc: 5          Complications: No notable events documented.

## 2021-10-02 NOTE — Telephone Encounter (Signed)
duplicate

## 2021-10-02 NOTE — Anesthesia Preprocedure Evaluation (Addendum)
Anesthesia Evaluation  Patient identified by MRN, date of birth, ID band Patient awake    Reviewed: Allergy & Precautions, NPO status , Patient's Chart, lab work & pertinent test results  Airway Mallampati: I       Dental no notable dental hx.    Pulmonary neg pulmonary ROS,    Pulmonary exam normal        Cardiovascular negative cardio ROS Normal cardiovascular exam     Neuro/Psych negative neurological ROS  negative psych ROS   GI/Hepatic negative GI ROS, Neg liver ROS,   Endo/Other  negative endocrine ROS  Renal/GU negative Renal ROS  negative genitourinary   Musculoskeletal negative musculoskeletal ROS (+)   Abdominal Normal abdominal exam  (+)   Peds  Hematology   Anesthesia Other Findings   Reproductive/Obstetrics negative OB ROS                             Anesthesia Physical Anesthesia Plan  ASA: 2  Anesthesia Plan: General   Post-op Pain Management: Regional block*   Induction: Intravenous  PONV Risk Score and Plan: 3 and Ondansetron, Dexamethasone and Midazolam  Airway Management Planned: LMA  Additional Equipment: None  Intra-op Plan:   Post-operative Plan: Extubation in OR  Informed Consent: I have reviewed the patients History and Physical, chart, labs and discussed the procedure including the risks, benefits and alternatives for the proposed anesthesia with the patient or authorized representative who has indicated his/her understanding and acceptance.     Dental advisory given  Plan Discussed with: CRNA  Anesthesia Plan Comments:         Anesthesia Quick Evaluation

## 2021-10-03 ENCOUNTER — Encounter (HOSPITAL_BASED_OUTPATIENT_CLINIC_OR_DEPARTMENT_OTHER): Payer: Self-pay | Admitting: Plastic Surgery

## 2021-10-03 DIAGNOSIS — D0512 Intraductal carcinoma in situ of left breast: Secondary | ICD-10-CM | POA: Diagnosis not present

## 2021-10-03 MED ORDER — DIAZEPAM 2 MG PO TABS: 2.0000 mg | ORAL_TABLET | Freq: Two times a day (BID) | ORAL | 0 refills | Status: DC | PRN

## 2021-10-03 NOTE — Progress Notes (Signed)
Progress Note: General Surgery Service   Chief Complaint/Subjective: Some nausea last night but improved today.  Some incisional pain.  Otherwise doing well on postoperative day 1.  Objective: Vital signs in last 24 hours: Temp:  [97 F (36.1 C)-97.9 F (36.6 C)] 97.9 F (36.6 C) (07/11 2315) Pulse Rate:  [51-87] 57 (07/12 0515) Resp:  [9-21] 16 (07/11 2315) BP: (85-136)/(52-96) 88/65 (07/11 2315) SpO2:  [94 %-100 %] 99 % (07/12 0515) Weight:  [62.1 kg] 62.1 kg (07/11 0851)    Intake/Output from previous day: 07/11 0701 - 07/12 0700 In: 1140 [P.O.:240; I.V.:900] Out: 795 [Urine:700; Drains:45; Blood:50] Intake/Output this shift: Total I/O In: 120 [P.O.:120] Out: 430 [Urine:400; Drains:30]  Gen: Awake and alert  Chest: Bilateral mastectomy flaps are viable with no evidence of complication such as hematoma.  Drains in place bilaterally.  Lab Results: CBC  No results for input(s): "WBC", "HGB", "HCT", "PLT" in the last 72 hours. BMET No results for input(s): "NA", "K", "CL", "CO2", "GLUCOSE", "BUN", "CREATININE", "CALCIUM" in the last 72 hours. PT/INR No results for input(s): "LABPROT", "INR" in the last 72 hours. ABG No results for input(s): "PHART", "HCO3" in the last 72 hours.  Invalid input(s): "PCO2", "PO2"  Anti-infectives: Anti-infectives (From admission, onward)    Start     Dose/Rate Route Frequency Ordered Stop   10/02/21 1400  ceFAZolin (ANCEF) IVPB 2g/100 mL premix        2 g 200 mL/hr over 30 Minutes Intravenous Every 8 hours 10/02/21 1309 10/09/21 1359   10/02/21 1015  ceFAZolin 1 g / gentamicin 80 mg in NS 500 mL surgical irrigation  Status:  Discontinued          As needed 10/02/21 1015 10/02/21 1312   10/02/21 0845  ceFAZolin (ANCEF) IVPB 2g/100 mL premix        2 g 200 mL/hr over 30 Minutes Intravenous On call to O.R. 10/02/21 0840 10/02/21 1058       Medications: Scheduled Meds:  acetaminophen  325 mg Oral Q6H   Chlorhexidine Gluconate  Cloth  6 each Topical Once   ibuprofen  400 mg Oral Q6H   scopolamine  1 patch Transdermal Q72H   senna  1 tablet Oral BID   Continuous Infusions:   ceFAZolin (ANCEF) IV Stopped (10/02/21 2341)   dextrose 5 % and 0.45 % NaCl with KCl 20 mEq/L 100 mL/hr at 10/02/21 1600   lactated ringers Stopped (10/02/21 1315)   PRN Meds:.bisacodyl, diazepam, diphenhydrAMINE **OR** diphenhydrAMINE, HYDROmorphone (DILAUDID) injection, HYDROmorphone (DILAUDID) injection, melatonin, meperidine (DEMEROL) injection, ondansetron **OR** ondansetron (ZOFRAN) IV, oxyCODONE, polyethylene glycol  Assessment/Plan: s/p Procedure(s): BREAST RECONSTRUCTION WITH PLACEMENT OF TISSUE EXPANDER AND FLEX HD (ACELLULAR HYDRATED DERMIS) SIMPLE MASTECTOMY 10/02/2021  Doing well after surgery.  Discharge today per plastic surgery team.  I will follow in the office.   LOS: 0 days   Pollyann Samples, MD [facility]

## 2021-10-03 NOTE — Discharge Summary (Signed)
Physician Discharge Summary  Patient ID: Shelly Hensley MRN: [id number] DOB/AGE: 41-Oct-1982 41 y.o.  Admit date: 10/02/2021 Discharge date: 10/03/2021  Admission Diagnoses: Left breast cancer  Discharge Diagnoses:  Principal Problem:   Breast cancer [facility])   Discharged Condition: good  Hospital Course: Patient is a 41 year old female with history of left breast cancer who presented to the [facility] yesterday.  She underwent bilateral mastectomies with Dr. Lilia Pro and immediate breast reconstruction with placement of tissue expanders and Flex HD with Dr. Marla Roe yesterday.  Today she is postop day 1.  Patient states she did well overnight.  She denies any acute events overnight.  She reports her pain has been controlled with medications.  She denies fevers or chills.  Patient states she has been eating and drinking without issue.  She reports that she has been voiding and that she has been passing gas.  Patient reports she is ready to go home today.  Consults: None  Significant Diagnostic Studies: N/A  Treatments: surgery: Bilateral mastectomies with immediate breast reconstruction on 10/02/2021.  Discharge Exam: Blood pressure 107/66, pulse 74, temperature 97.9 F (36.6 C), resp. rate 16, height 5' 2.5" (1.588 m), weight 62.1 kg, last menstrual period 09/11/2021, SpO2 100 %. General appearance: alert, cooperative, and no distress Resp: Unlabored breathing, no respiratory distress Breasts: Breasts are fairly symmetric and soft.  Expanders are in place.  Honeycomb dressings are clean dry and intact.  There is some mild swelling to the left lateral chest wall/axilla.  There is no overlying erythema or ecchymosis to either breast.  Drains are in place and functioning bilaterally.  There is approximately 10 cc of serosanguineous drainage in each bulb. Extremities: Lower extremities are nonedematous, nontender upon palpation.  Disposition: Discharge disposition:  01-Home or Self Care       Discharge Instructions     (HEART FAILURE PATIENTS) Call MD:  Anytime you have any of the following symptoms: 1) 3 pound weight gain in 24 hours or 5 pounds in 1 week 2) shortness of breath, with or without a dry hacking cough 3) swelling in the hands, feet or stomach 4) if you have to sleep on extra pillows at night in order to breathe.   Complete by: As directed    Call MD for:  difficulty breathing, headache or visual disturbances   Complete by: As directed    Call MD for:  extreme fatigue   Complete by: As directed    Call MD for:  hives   Complete by: As directed    Call MD for:  persistant dizziness or light-headedness   Complete by: As directed    Call MD for:  persistant nausea and vomiting   Complete by: As directed    Call MD for:  redness, tenderness, or signs of infection (pain, swelling, redness, odor or green/yellow discharge around incision site)   Complete by: As directed    Call MD for:  severe uncontrolled pain   Complete by: As directed    Call MD for:  temperature >100.4   Complete by: As directed    Diet - low sodium heart healthy   Complete by: As directed    Increase activity slowly   Complete by: As directed       Allergies as of 10/03/2021       Reactions   Other Other (See Comments)   Tree nuts        Medication List     TAKE these medications  Cholecalciferol 100 MCG (4000 UT) Caps Take 1 tablet by mouth daily.   ondansetron 4 MG tablet Commonly known as: Zofran Take 1 tablet (4 mg total) by mouth every 8 (eight) hours as needed for nausea or vomiting.   OVER THE COUNTER MEDICATION Juice Plus-Taking daily.   sulfamethoxazole-trimethoprim 800-160 MG tablet Commonly known as: Bactrim DS Take 1 tablet by mouth 2 (two) times daily for 14 days.        Follow-up Information     Dillingham, Loel Lofty, DO Follow up in 10 day(s).   Specialty: Plastic Surgery Contact information: [address] [place]  [place] [address] [phone]                 [facility] [address] [place], [place] [address] [phone]  Signed: Clance Boll 10/03/2021, 8:31 AM

## 2021-10-04 LAB — SURGICAL PATHOLOGY

## 2021-10-05 ENCOUNTER — Telehealth: Payer: Self-pay | Admitting: *Deleted

## 2021-10-05 NOTE — Telephone Encounter (Signed)
Received on (09/26/2021) via of fax DME Standard Written Order from [employer] [facility] requesting signature,date, and return.  Given to provider to sign and return.  DME Standard Written Order signed and faxed back to [employer] [facility].  Confirmation received and copy scanned into the chart.//AB/CMA

## 2021-10-09 ENCOUNTER — Encounter: Payer: Self-pay | Admitting: Student

## 2021-10-09 ENCOUNTER — Ambulatory Visit (INDEPENDENT_AMBULATORY_CARE_PROVIDER_SITE_OTHER): Payer: Commercial Managed Care - PPO | Admitting: Student

## 2021-10-09 DIAGNOSIS — D0512 Intraductal carcinoma in situ of left breast: Secondary | ICD-10-CM

## 2021-10-09 NOTE — H&P (View-Only) (Signed)
Patient is a 41 year old female with history of left breast cancer.  Patient underwent bilateral mastectomies with Dr. Lilia Pro and then underwent immediate bilateral breast reconstruction with placement of acellular dermal matrix and tissue expanders with Dr. Marla Roe on 10/02/2021.  Patient had Mentor 450 cc expanders placed on each side.  150 cc of injectable saline was placed intraoperatively bilaterally.  Patient presents today for postoperative follow-up.  Today, patient reports she is doing well.  She states she had some pain postop days 2 and 3, but she says she is feeling better now.  She states the oxycodone made her nauseous.  She reports though that the Tylenol and ibuprofen have been controlling her pain.  Patient reports she has been getting out approximately 15 to 20 mL from her drains bilaterally daily.  She denies any fevers or chills.  She denies any issues at the surgical sites.   Chaperone present on exam.  On exam, patient sitting upright in no acute distress.  Expanders are in place bilaterally.  Honeycomb dressings in place, they are clean dry and intact.  Steri-Strips appear intact beneath the honeycomb dressings.  There is no overlying erythema or ecchymosis to the breast bilaterally.  There is some mild swelling noted, there are no significant fluid collections or signs of hematoma noted on exam.  Drains are in place and functioning bilaterally.  There is approximately 10 cc of serosanguineous drainage in each bulb.  We placed injectable saline in the Expander using a sterile technique: Right: 50 cc for a total of 200 cc /455 cc Left: 50 cc for a total of 200 cc /455 cc   Discussed with patient to continue to monitor drain output.  Discussed with the patient to continue compression.  Discussed with the patient that she may continue with the Tylenol and ibuprofen.  Also discussed with the patient she may use the Valium after today's expander fill if she has any pain.  All of the  patient's questions were answered.  Patient to follow-up next week for possible expander fill and to evaluate if the drains may be removed.  Instructed patient to call if she has any questions or concerns.  Patient also examined by Dr. Marla Roe.

## 2021-10-09 NOTE — Progress Notes (Signed)
Patient is a 41 year old female with history of left breast cancer.  Patient underwent bilateral mastectomies with Dr. Lilia Pro and then underwent immediate bilateral breast reconstruction with placement of acellular dermal matrix and tissue expanders with Dr. Marla Roe on 10/02/2021.  Patient had Mentor 450 cc expanders placed on each side.  150 cc of injectable saline was placed intraoperatively bilaterally.  Patient presents today for postoperative follow-up.  Today, patient reports she is doing well.  She states she had some pain postop days 2 and 3, but she says she is feeling better now.  She states the oxycodone made her nauseous.  She reports though that the Tylenol and ibuprofen have been controlling her pain.  Patient reports she has been getting out approximately 15 to 20 mL from her drains bilaterally daily.  She denies any fevers or chills.  She denies any issues at the surgical sites.   Chaperone present on exam.  On exam, patient sitting upright in no acute distress.  Expanders are in place bilaterally.  Honeycomb dressings in place, they are clean dry and intact.  Steri-Strips appear intact beneath the honeycomb dressings.  There is no overlying erythema or ecchymosis to the breast bilaterally.  There is some mild swelling noted, there are no significant fluid collections or signs of hematoma noted on exam.  Drains are in place and functioning bilaterally.  There is approximately 10 cc of serosanguineous drainage in each bulb.  We placed injectable saline in the Expander using a sterile technique: Right: 50 cc for a total of 200 cc /455 cc Left: 50 cc for a total of 200 cc /455 cc   Discussed with patient to continue to monitor drain output.  Discussed with the patient to continue compression.  Discussed with the patient that she may continue with the Tylenol and ibuprofen.  Also discussed with the patient she may use the Valium after today's expander fill if she has any pain.  All of the  patient's questions were answered.  Patient to follow-up next week for possible expander fill and to evaluate if the drains may be removed.  Instructed patient to call if she has any questions or concerns.  Patient also examined by Dr. Marla Roe.

## 2021-10-10 ENCOUNTER — Encounter: Payer: Commercial Managed Care - PPO | Admitting: Surgical

## 2021-10-10 ENCOUNTER — Encounter: Payer: Commercial Managed Care - PPO | Admitting: Plastic Surgery

## 2021-10-16 ENCOUNTER — Ambulatory Visit (INDEPENDENT_AMBULATORY_CARE_PROVIDER_SITE_OTHER): Payer: Commercial Managed Care - PPO | Admitting: Student

## 2021-10-16 DIAGNOSIS — D0512 Intraductal carcinoma in situ of left breast: Secondary | ICD-10-CM

## 2021-10-16 NOTE — Progress Notes (Signed)
Patient is a 40 year old female with history of left breast cancer.  Patient underwent bilateral mastectomies with Dr. Lilia Pro and then underwent immediate bilateral breast reconstruction with placement of acellular dermal matrix and tissue expanders with Dr. Marla Roe on 10/02/2021.   Patient presents to the clinic today for postoperative follow-up.  Patient was last seen in the clinic on 10/09/2021.  At this visit, patient reported she was doing well.  On exam, honeycomb dressings were in place and they were clean dry intact bilaterally.  Expanders in place bilaterally.  Drains were in place and functioning.  Patient had 50 cc of injectable saline placed in each expander for a total of 200 cc / 455 cc bilaterally.  Today, patient reports she is doing really well.  She states that her pain is controlled.  She states that she really only has to take the Motrin occasionally for pain.  She reports that she did have a little bit of pain after her last fill, but she states it was very limited.  She denies any other issues or concerns at this time.  Patient reports that her drains have been putting out 5 cc of a light clearish fluid daily.  She denies any fevers or chills.  She denies any issues at the surgical site.  Patient reports she would like an expander fill today  On exam, patient is sitting upright in no acute distress.  The expanders are in place bilaterally.  There is no overlying ecchymosis or erythema.  The left breast has some mild swelling compared to the right.  There is no significant fluid collections palpated on exam.  Honeycomb dressings were removed.  Incisions intact with Steri-Strips bilaterally.  No surrounding erythema to the incisions.  Drains in place and functioning bilaterally.  Approximately 5 cc of serous fluid in each bulb.  Drains were removed bilaterally.  Patient tolerated well.  Vaseline and gauze was replaced over the drain sites.  We placed injectable saline in the Expander  using a sterile technique: Right: 50 cc for a total of 250 cc / 455 cc Left: 50 cc for a total of 250 cc / 455 cc  Discussed with the patient to continue compressive sports bra.  Discussed with the patient to avoid heavy lifting or strenuous activities.  Discussed with the patient that she may put a small amount of Vaseline and gauze over the drain sites daily.  Discussed with the patient she should still continue to sleep on her back.  All of the patient's questions were answered.  Patient to follow-up next week for evaluation and expander fill.  Instructed patient to call if she has any questions or concerns.

## 2021-10-23 ENCOUNTER — Ambulatory Visit: Payer: Commercial Managed Care - PPO | Admitting: Student

## 2021-10-26 ENCOUNTER — Encounter: Payer: Self-pay | Admitting: Oncology

## 2021-10-26 ENCOUNTER — Inpatient Hospital Stay: Payer: Commercial Managed Care - PPO

## 2021-10-26 ENCOUNTER — Other Ambulatory Visit: Payer: Self-pay | Admitting: Oncology

## 2021-10-26 ENCOUNTER — Inpatient Hospital Stay: Payer: Commercial Managed Care - PPO | Attending: Oncology | Admitting: Oncology

## 2021-10-26 VITALS — BP 113/85 | HR 68 | Temp 98.2°F | Resp 18 | Ht 61.7 in | Wt 138.9 lb

## 2021-10-26 DIAGNOSIS — Z1502 Genetic susceptibility to malignant neoplasm of ovary: Secondary | ICD-10-CM

## 2021-10-26 DIAGNOSIS — D0512 Intraductal carcinoma in situ of left breast: Secondary | ICD-10-CM

## 2021-10-26 DIAGNOSIS — Z170 Estrogen receptor positive status [ER+]: Secondary | ICD-10-CM

## 2021-10-26 DIAGNOSIS — Z1589 Genetic susceptibility to other disease: Secondary | ICD-10-CM

## 2021-10-26 DIAGNOSIS — Z1501 Genetic susceptibility to malignant neoplasm of breast: Secondary | ICD-10-CM

## 2021-10-26 DIAGNOSIS — Z1509 Genetic susceptibility to other malignant neoplasm: Secondary | ICD-10-CM

## 2021-10-26 DIAGNOSIS — Z9013 Acquired absence of bilateral breasts and nipples: Secondary | ICD-10-CM

## 2021-10-26 LAB — BASIC METABOLIC PANEL
BUN: 12 (ref 4–21)
CO2: 23 — AB (ref 13–22)
Chloride: 105 (ref 99–108)
Creatinine: 0.7 (ref 0.5–1.1)
Glucose: 89
Potassium: 3.8 mEq/L (ref 3.5–5.1)
Sodium: 136 — AB (ref 137–147)

## 2021-10-26 LAB — HEPATIC FUNCTION PANEL
ALT: 22 U/L (ref 7–35)
AST: 26 (ref 13–35)
Alkaline Phosphatase: 46 (ref 25–125)
Bilirubin, Total: 0.5

## 2021-10-26 LAB — CBC AND DIFFERENTIAL
HCT: 37 (ref 36–46)
Hemoglobin: 12.6 (ref 12.0–16.0)
Neutrophils Absolute: 2.51
Platelets: 203 10*3/uL (ref 150–400)
WBC: 4.4

## 2021-10-26 LAB — COMPREHENSIVE METABOLIC PANEL
Albumin: 4.2 (ref 3.5–5.0)
Calcium: 9 (ref 8.7–10.7)

## 2021-10-26 LAB — CBC: RBC: 3.94 (ref 3.87–5.11)

## 2021-10-30 ENCOUNTER — Ambulatory Visit (INDEPENDENT_AMBULATORY_CARE_PROVIDER_SITE_OTHER): Payer: Commercial Managed Care - PPO | Admitting: Physician Assistant

## 2021-10-30 DIAGNOSIS — D0512 Intraductal carcinoma in situ of left breast: Secondary | ICD-10-CM

## 2021-10-30 NOTE — H&P (View-Only) (Signed)
This is a 41 year old female seen in our office for follow-up evaluation.  The patient is status post bilateral mastectomies with Dr. Lilia Pro followed by immediate bilateral breast reconstruction with placement of acellular dermal matrix and tissue expanders by Dr. Marla Roe on 10/02/2021.  No adjuvant chemotherapy or radiation.  The patient was last seen on 10/16/2001 approximately 2 weeks ago.  At that time she had done very well.  Her drains were removed without issue or complication.  She had 50 cc of injectable saline placed in each expander totaling 250 cc / 455 cc bilaterally.  She notes that since her last office visit she has been doing very well, she notes no significant pain after the fill.   On exam she is resting in the exam chair no acute distress, the right breast has a very small area of bruising at the previous fill site, no swelling or tenderness at this site, the breasts were palpated with no fluid collections, the flaps are viable the incisions are covered in Steri-Strips with no surrounding redness or warmth.  The patient notes that preoperatively she was a C cup and would like to be somewhat similar to that size.  I discussed placing 50 more cc in each expander, the patient wanted to proceed.  This was completed.  Right: 50 cc for a total of 300/455 Left: 50 cc for a total of 300/455  The patient will follow-up in 2 weeks in our office for follow-up evaluation and potential continued expander fill depending on her comfort level.  She also noted that on August 28 she will be 7 weeks out from surgery which is the amount of time she was originally given for [employer].  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

## 2021-10-30 NOTE — Progress Notes (Signed)
This is a 41 year old female seen in our office for follow-up evaluation.  The patient is status post bilateral mastectomies with Dr. Lilia Pro followed by immediate bilateral breast reconstruction with placement of acellular dermal matrix and tissue expanders by Dr. Marla Roe on 10/02/2021.  No adjuvant chemotherapy or radiation.  The patient was last seen on 10/16/2001 approximately 2 weeks ago.  At that time she had done very well.  Her drains were removed without issue or complication.  She had 50 cc of injectable saline placed in each expander totaling 250 cc / 455 cc bilaterally.  She notes that since her last office visit she has been doing very well, she notes no significant pain after the fill.   On exam she is resting in the exam chair no acute distress, the right breast has a very small area of bruising at the previous fill site, no swelling or tenderness at this site, the breasts were palpated with no fluid collections, the flaps are viable the incisions are covered in Steri-Strips with no surrounding redness or warmth.  The patient notes that preoperatively she was a C cup and would like to be somewhat similar to that size.  I discussed placing 50 more cc in each expander, the patient wanted to proceed.  This was completed.  Right: 50 cc for a total of 300/455 Left: 50 cc for a total of 300/455  The patient will follow-up in 2 weeks in our office for follow-up evaluation and potential continued expander fill depending on her comfort level.  She also noted that on August 28 she will be 7 weeks out from surgery which is the amount of time she was originally given for [employer].  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

## 2021-11-05 ENCOUNTER — Ambulatory Visit (INDEPENDENT_AMBULATORY_CARE_PROVIDER_SITE_OTHER): Payer: Commercial Managed Care - PPO | Admitting: Physician Assistant

## 2021-11-05 ENCOUNTER — Emergency Department (HOSPITAL_BASED_OUTPATIENT_CLINIC_OR_DEPARTMENT_OTHER): Payer: Commercial Managed Care - PPO

## 2021-11-05 ENCOUNTER — Inpatient Hospital Stay (HOSPITAL_BASED_OUTPATIENT_CLINIC_OR_DEPARTMENT_OTHER)
Admission: EM | Admit: 2021-11-05 | Discharge: 2021-11-09 | DRG: 856 | Disposition: A | Discharge status: Home / Self Care | Payer: Commercial Managed Care - PPO | Attending: Family Medicine | Admitting: Family Medicine

## 2021-11-05 ENCOUNTER — Encounter (HOSPITAL_BASED_OUTPATIENT_CLINIC_OR_DEPARTMENT_OTHER): Payer: Self-pay

## 2021-11-05 ENCOUNTER — Other Ambulatory Visit: Payer: Self-pay

## 2021-11-05 ENCOUNTER — Encounter: Payer: Self-pay | Admitting: Surgical

## 2021-11-05 ENCOUNTER — Encounter: Payer: Self-pay | Admitting: Physician Assistant

## 2021-11-05 VITALS — BP 109/76 | HR 96 | Temp 98.4°F

## 2021-11-05 DIAGNOSIS — Y834 Other reconstructive surgery as the cause of abnormal reaction of the patient, or of later complication, without mention of misadventure at the time of the procedure: Secondary | ICD-10-CM | POA: Diagnosis present

## 2021-11-05 DIAGNOSIS — E041 Nontoxic single thyroid nodule: Secondary | ICD-10-CM | POA: Diagnosis present

## 2021-11-05 DIAGNOSIS — T8142XA Infection following a procedure, deep incisional surgical site, initial encounter: Principal | ICD-10-CM

## 2021-11-05 DIAGNOSIS — A419 Sepsis, unspecified organism: Secondary | ICD-10-CM | POA: Diagnosis present

## 2021-11-05 DIAGNOSIS — N611 Abscess of the breast and nipple: Secondary | ICD-10-CM | POA: Diagnosis present

## 2021-11-05 DIAGNOSIS — E860 Dehydration: Secondary | ICD-10-CM | POA: Diagnosis present

## 2021-11-05 DIAGNOSIS — Z91018 Allergy to other foods: Secondary | ICD-10-CM

## 2021-11-05 DIAGNOSIS — I951 Orthostatic hypotension: Secondary | ICD-10-CM

## 2021-11-05 DIAGNOSIS — D0592 Unspecified type of carcinoma in situ of left breast: Secondary | ICD-10-CM | POA: Diagnosis present

## 2021-11-05 DIAGNOSIS — Z79899 Other long term (current) drug therapy: Secondary | ICD-10-CM

## 2021-11-05 DIAGNOSIS — T8140XA Infection following a procedure, unspecified, initial encounter: Secondary | ICD-10-CM | POA: Diagnosis not present

## 2021-11-05 DIAGNOSIS — D72829 Elevated white blood cell count, unspecified: Secondary | ICD-10-CM | POA: Diagnosis present

## 2021-11-05 DIAGNOSIS — D0512 Intraductal carcinoma in situ of left breast: Secondary | ICD-10-CM

## 2021-11-05 DIAGNOSIS — R55 Syncope and collapse: Secondary | ICD-10-CM | POA: Diagnosis present

## 2021-11-05 DIAGNOSIS — D649 Anemia, unspecified: Secondary | ICD-10-CM | POA: Diagnosis present

## 2021-11-05 DIAGNOSIS — Z9013 Acquired absence of bilateral breasts and nipples: Secondary | ICD-10-CM

## 2021-11-05 DIAGNOSIS — R8290 Unspecified abnormal findings in urine: Secondary | ICD-10-CM | POA: Diagnosis present

## 2021-11-05 LAB — BASIC METABOLIC PANEL
Anion gap: 9 (ref 5–15)
BUN: 8 mg/dL (ref 6–20)
CO2: 24 mmol/L (ref 22–32)
Calcium: 9.2 mg/dL (ref 8.9–10.3)
Chloride: 103 mmol/L (ref 98–111)
Creatinine, Ser: 0.69 mg/dL (ref 0.44–1.00)
GFR, Estimated: >60 mL/min (ref >60)
Glucose, Bld: 145 mg/dL — ABNORMAL HIGH (ref 70–99)
Potassium: 3.7 mmol/L (ref 3.5–5.1)
Sodium: 136 mmol/L (ref 135–145)

## 2021-11-05 LAB — CBC WITH DIFFERENTIAL/PLATELET
Abs Immature Granulocytes: 0.09 10*3/uL — ABNORMAL HIGH (ref 0.00–0.07)
Basophils Absolute: 0 10*3/uL (ref 0.0–0.1)
Basophils Relative: 0 %
Eosinophils Absolute: 0 10*3/uL (ref 0.0–0.5)
Eosinophils Relative: 0 %
HCT: 34.4 % — ABNORMAL LOW (ref 36.0–46.0)
Hemoglobin: 11.7 g/dL — ABNORMAL LOW (ref 12.0–15.0)
Immature Granulocytes: 1 %
Lymphocytes Relative: 4 %
Lymphs Abs: 0.6 10*3/uL — ABNORMAL LOW (ref 0.7–4.0)
MCH: 32.1 pg (ref 26.0–34.0)
MCHC: 34 g/dL (ref 30.0–36.0)
MCV: 94.5 fL (ref 80.0–100.0)
Monocytes Absolute: 1.2 10*3/uL — ABNORMAL HIGH (ref 0.1–1.0)
Monocytes Relative: 9 %
Neutro Abs: 11.7 10*3/uL — ABNORMAL HIGH (ref 1.7–7.7)
Neutrophils Relative %: 86 %
Platelets: 186 10*3/uL (ref 150–400)
RBC: 3.64 MIL/uL — ABNORMAL LOW (ref 3.87–5.11)
RDW: 12.8 % (ref 11.5–15.5)
WBC: 13.6 10*3/uL — ABNORMAL HIGH (ref 4.0–10.5)
nRBC: 0 % (ref 0.0–0.2)

## 2021-11-05 LAB — CBG MONITORING, ED: Glucose-Capillary: 139 mg/dL — ABNORMAL HIGH (ref 70–99)

## 2021-11-05 LAB — URINALYSIS, ROUTINE W REFLEX MICROSCOPIC
Bilirubin Urine: NEGATIVE
Glucose, UA: NEGATIVE mg/dL
Ketones, ur: NEGATIVE mg/dL
Nitrite: NEGATIVE
Specific Gravity, Urine: 1.01 (ref 1.005–1.030)
pH: 6 (ref 5.0–8.0)

## 2021-11-05 LAB — LACTIC ACID, PLASMA: Lactic Acid, Venous: 0.9 mmol/L (ref 0.5–1.9)

## 2021-11-05 LAB — PREGNANCY, URINE: Preg Test, Ur: NEGATIVE

## 2021-11-05 MED ORDER — SODIUM CHLORIDE 0.9 % IV BOLUS
1000.0000 mL | Freq: Once | INTRAVENOUS | Status: AC
  Administered 2021-11-06: 1000 mL via INTRAVENOUS

## 2021-11-05 MED ORDER — SULFAMETHOXAZOLE-TRIMETHOPRIM 800-160 MG PO TABS: 1.0000 | ORAL_TABLET | Freq: Two times a day (BID) | ORAL | 0 refills | Status: DC

## 2021-11-05 MED ORDER — PROCHLORPERAZINE EDISYLATE 10 MG/2ML IJ SOLN
5.0000 mg | Freq: Once | INTRAMUSCULAR | Status: AC
  Administered 2021-11-06: 5 mg via INTRAVENOUS
  Filled 2021-11-05: qty 2

## 2021-11-05 MED ORDER — IOHEXOL 300 MG/ML  SOLN
75.0000 mL | Freq: Once | INTRAMUSCULAR | Status: AC | PRN
  Administered 2021-11-06: 75 mL via INTRAVENOUS

## 2021-11-05 NOTE — Progress Notes (Signed)
Patient is a 42 year old female status post bilateral breast reconstruction with tissue expanders currently in place. She called the on-call provider service this evening accompanied by her friend, who was bringing her dinner. Friend reports that when she was with the patient, she got up to go to the bathroom, and after approximately five steps had slowly fallen to the ground. Friend reports that the patient slowly lowered herself to th e floor with the help of hallway wall, does report that she fell on her butt, but then slowly fell backwards and possibly hit her head, reports that it was not very hard. She reports that patient is acting normally, speaking normally. She does report that the patient was evaluated in the clinic today for redness of her right breast, was prescribed anabiotic. Patient reports she has been using Tylenol and ibuprofen for pain control, and  four fever, reports temperature of 100 earlier today.  She denies any chest pain, or shortness of breath. She does endorsed experiencing chills throughout the day. Patient and friend reports she has been drinking fluids, reports drinking two Gatorade today, is experiencing nausea and poor appetite.  I discussed with patient and her friend that patient needs to be evaluate it in the emergency room, encouraged increased hydrat ion. Patient and friend, Shelly Hensley were in agreement to be evaluated in the emergency room and were planning to go after our phone call. I answered all their questions to their content. I notified them to call me with any further questions or concerns.

## 2021-11-05 NOTE — ED Provider Notes (Signed)
[facility] EMERGENCY DEPT Provider Note  CSN: [id number] Arrival date & time: 11/05/21 2037  Chief Complaint(s) syncopal episode  HPI Shelly PIGGOTT is a 41 y.o. 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  No other pathogenic variants detected in [facility] Panel.  Report date is Jul 23, 2021.   The BRCAplus panel offered by [employer] and includes sequencing and deletion/duplication analysis for the following 8 genes: ATM, BRCA1, BRCA2, CDH1, CHEK2, PALB2, PTEN, and TP53.  Results of pan-cancer panel are pending.    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 Authorizing Provider  Aloe Vera 25 MG CAPS Take 1 capsule by mouth daily.    [provider]  Cholecalciferol 100 MCG (4000 UT) CAPS Take 1 tablet by mouth daily.    [provider]  diazepam (VALIUM) 2 MG tablet Take 1 tablet (2 mg total) by mouth every 12 (twelve) hours as needed for muscle spasms. 10/03/21   Scheeler, Carola Rhine, PA-C  Lactobacillus (PROBIOTIC ACIDOPHILUS PO) Take by mouth. Takes it daily at this time    [provider]  loratadine (CLARITIN) 10 MG tablet  08/26/17   [provider]  ondansetron (ZOFRAN) 4 MG tablet Take 1 tablet (4 mg total) by mouth every 8 (eight) hours as needed for nausea or vomiting. 09/26/21   Scheeler, Carola Rhine, PA-C  OVER THE COUNTER MEDICATION Juice Plus-Taking daily.    [provider]  sulfamethoxazole-trimethoprim (BACTRIM DS) 800-160 MG tablet Take 1 tablet by mouth 2 (two) times daily. 11/05/21   Hedges, Dellis Filbert, PA-C  valACYclovir (VALTREX) 1000 MG tablet Take 2,000 mg by mouth 2 (two) times daily as needed. As needed for fever blisters 05/21/21   [provider]                                                                                                                                    Allergies Other  Review of Systems Review of Systems As noted in HPI  Physical Exam Vital Signs  I have reviewed the triage vital signs BP 114/76 (BP Location: Right Arm)   Pulse 89  Temp 98.2 F (36.8 C)   Resp 16   Ht 5' 2"  (1.575 m)   Wt 61.2 kg   LMP 10/31/2021 (Exact Date)   SpO2 99%   BMI 24.69 kg/m  *** Physical Exam  ED Results and Treatments Labs (all labs ordered are listed, but only abnormal results are displayed) Labs Reviewed  BASIC METABOLIC PANEL - Abnormal; Notable for the following components:      Result Value   Glucose, Bld 145 (*)    All other components within normal limits  URINALYSIS, ROUTINE W REFLEX MICROSCOPIC - Abnormal; Notable for the following components:   Hgb urine dipstick MODERATE (*)    Protein, ur TRACE (*)    Leukocytes,Ua TRACE (*)    Bacteria, UA RARE (*)    All other components within normal limits  CBC WITH DIFFERENTIAL/PLATELET - Abnormal; Notable for the following components:   WBC 13.6 (*)    RBC 3.64 (*)    Hemoglobin 11.7 (*)    HCT 34.4 (*)    Neutro Abs 11.7 (*)    Lymphs Abs 0.6 (*)    Monocytes Absolute 1.2 (*)    Abs Immature Granulocytes 0.09 (*)    All other components within normal limits  CBG MONITORING, ED - Abnormal; Notable for the following  components:   Glucose-Capillary 139 (*)    All other components within normal limits  CULTURE, BLOOD (ROUTINE X 2)  CULTURE, BLOOD (ROUTINE X 2)  PREGNANCY, URINE  LACTIC ACID, PLASMA  LACTIC ACID, PLASMA                                                                                                                         EKG  EKG Interpretation  Date/Time:    Ventricular Rate:    PR Interval:    QRS Duration:   QT Interval:    QTC Calculation:   R Axis:     Text Interpretation:         Radiology No results found.  Medications Ordered in ED Medications - No data to display                                                                                                                                   Procedures Procedures  (including critical care time)  Medical Decision Making / ED Course   Medical Decision Making Amount and/or Complexity of Data Reviewed  Labs: ordered.          Final Clinical Impression(s) / ED Diagnoses Final diagnoses:  None    {Document critical care time when appropriate:1}  {Document review of labs and clinical decision tools ie heart score, Chads2Vasc2 etc:1}  {Document your independent review of radiology images, and any outside records:1} {Document your discussion with family members, caretakers, and with consultants:1} {Document social determinants of health affecting pt's care:1} {Document your decision making why or why not admission, treatments were needed:1} This chart was dictated using voice recognition software.  Despite best efforts to proofread,  errors can occur which can change the documentation meaning.

## 2021-11-05 NOTE — H&P (View-Only) (Signed)
Patient is a 41 year old female status post bilateral breast reconstruction with tissue expanders currently in place. She called the on-call provider service this evening accompanied by her friend, who was bringing her dinner. Friend reports that when she was with the patient, she got up to go to the bathroom, and after approximately five steps had slowly fallen to the ground. Friend reports that the patient slowly lowered herself to th e floor with the help of hallway wall, does report that she fell on her butt, but then slowly fell backwards and possibly hit her head, reports that it was not very hard. She reports that patient is acting normally, speaking normally. She does report that the patient was evaluated in the clinic today for redness of her right breast, was prescribed anabiotic. Patient reports she has been using Tylenol and ibuprofen for pain control, and  four fever, reports temperature of 100 earlier today.  She denies any chest pain, or shortness of breath. She does endorsed experiencing chills throughout the day. Patient and friend reports she has been drinking fluids, reports drinking two Gatorade today, is experiencing nausea and poor appetite.  I discussed with patient and her friend that patient needs to be evaluate it in the emergency room, encouraged increased hydrat ion. Patient and friend, Eustaquio Maize were in agreement to be evaluated in the emergency room and were planning to go after our phone call. I answered all their questions to their content. I notified them to call me with any further questions or concerns.

## 2021-11-05 NOTE — Progress Notes (Unsigned)
This is a pleasant 41 year old female seen in our office for evaluation of redness to the right breast.  She is status post bilateral mastectomies followed by immediate bilateral breast reconstruction with placement of acellular dermal matrix and tissue expanders by Dr. Marla Roe on 10/02/2021.  She was most recently seen in our office on 10/30/2021.  She had 50 cc of injectable saline placed into her bilateral expanders totaling 300/455.  She notes she had done well after the fill but 2 days ago developed some redness along the right lower breast.  She thought this was just irritation from her bra but noticed some associated nausea; no vomiting.   She denies any fever, she reports she did take ibuprofen for discomfort this morning.   Vitals:   11/05/21 1227  BP: 109/76  Pulse: 96  Temp: 98.4 F (36.9 C)    On exam her right breast does seem slightly larger when compared to the left, she has some erythema along the right lower breast, she has no discharge, her incisions are clean dry and intact, she has very minimal bruising at the last fill site.,  The area of redness does have warmth associated with it.       Shelly Hensley is a pleasant 41 year old female seen in our office for follow-up evaluation.  Unfortunately it does appear that she has infection along the right lower breast.  She has no signs of systemic illness at this time and no signs of fluid collection.  We will plan on calling a prescription for Bactrim, the patient will begin taking this today, if she develops any new or worsening signs or symptoms she is instructed to reach out to [place] to immediately.  The patient verbalized understanding and agreement to today's plan had no further questions or concerns.

## 2021-11-05 NOTE — ED Triage Notes (Addendum)
Syncopal episode today Fevers has been taking tylenol and Motrin Generalized fatigue + nausea Recently dx breast cancer in April  Mastectomy was 10/02/21 Redness under right breast +heat, saw plastic surgeon today and was given script for Bactrim (has not started)

## 2021-11-06 ENCOUNTER — Ambulatory Visit: Payer: Commercial Managed Care - PPO | Admitting: Physician Assistant

## 2021-11-06 ENCOUNTER — Emergency Department (HOSPITAL_BASED_OUTPATIENT_CLINIC_OR_DEPARTMENT_OTHER): Payer: Commercial Managed Care - PPO

## 2021-11-06 ENCOUNTER — Encounter (HOSPITAL_COMMUNITY): Payer: Self-pay

## 2021-11-06 DIAGNOSIS — A419 Sepsis, unspecified organism: Secondary | ICD-10-CM | POA: Diagnosis present

## 2021-11-06 DIAGNOSIS — D0512 Intraductal carcinoma in situ of left breast: Secondary | ICD-10-CM | POA: Diagnosis present

## 2021-11-06 DIAGNOSIS — R55 Syncope and collapse: Secondary | ICD-10-CM | POA: Diagnosis present

## 2021-11-06 DIAGNOSIS — Y834 Other reconstructive surgery as the cause of abnormal reaction of the patient, or of later complication, without mention of misadventure at the time of the procedure: Secondary | ICD-10-CM | POA: Diagnosis present

## 2021-11-06 DIAGNOSIS — D649 Anemia, unspecified: Secondary | ICD-10-CM | POA: Diagnosis present

## 2021-11-06 DIAGNOSIS — E041 Nontoxic single thyroid nodule: Secondary | ICD-10-CM | POA: Diagnosis present

## 2021-11-06 DIAGNOSIS — L7634 Postprocedural seroma of skin and subcutaneous tissue following other procedure: Secondary | ICD-10-CM | POA: Diagnosis not present

## 2021-11-06 DIAGNOSIS — E860 Dehydration: Secondary | ICD-10-CM | POA: Diagnosis present

## 2021-11-06 DIAGNOSIS — R8290 Unspecified abnormal findings in urine: Secondary | ICD-10-CM | POA: Diagnosis present

## 2021-11-06 DIAGNOSIS — Z91018 Allergy to other foods: Secondary | ICD-10-CM | POA: Diagnosis not present

## 2021-11-06 DIAGNOSIS — Z79899 Other long term (current) drug therapy: Secondary | ICD-10-CM | POA: Diagnosis not present

## 2021-11-06 DIAGNOSIS — D72829 Elevated white blood cell count, unspecified: Secondary | ICD-10-CM

## 2021-11-06 DIAGNOSIS — Z9013 Acquired absence of bilateral breasts and nipples: Secondary | ICD-10-CM | POA: Diagnosis not present

## 2021-11-06 DIAGNOSIS — T8140XA Infection following a procedure, unspecified, initial encounter: Secondary | ICD-10-CM | POA: Diagnosis present

## 2021-11-06 DIAGNOSIS — N611 Abscess of the breast and nipple: Secondary | ICD-10-CM | POA: Diagnosis present

## 2021-11-06 LAB — HIV ANTIBODY (ROUTINE TESTING W REFLEX): HIV Screen 4th Generation wRfx: NONREACTIVE

## 2021-11-06 MED ORDER — SODIUM CHLORIDE 0.9 % IV SOLN: INTRAVENOUS | Status: DC

## 2021-11-06 MED ORDER — ONDANSETRON HCL 4 MG/2ML IJ SOLN
4.0000 mg | Freq: Four times a day (QID) | INTRAMUSCULAR | Status: DC | PRN
  Administered 2021-11-06: 4 mg via INTRAVENOUS
  Filled 2021-11-06: qty 2

## 2021-11-06 MED ORDER — ENOXAPARIN SODIUM 40 MG/0.4ML IJ SOSY
40.0000 mg | PREFILLED_SYRINGE | INTRAMUSCULAR | Status: DC
  Administered 2021-11-06 – 2021-11-09 (×3): 40 mg via SUBCUTANEOUS
  Filled 2021-11-06 (×3): qty 0.4

## 2021-11-06 MED ORDER — ONDANSETRON HCL 4 MG PO TABS: 4.0000 mg | ORAL_TABLET | Freq: Four times a day (QID) | ORAL | Status: DC | PRN

## 2021-11-06 MED ORDER — IBUPROFEN 400 MG PO TABS
400.0000 mg | ORAL_TABLET | Freq: Four times a day (QID) | ORAL | Status: DC | PRN
  Administered 2021-11-06 – 2021-11-08 (×3): 400 mg via ORAL
  Filled 2021-11-06 (×3): qty 1

## 2021-11-06 MED ORDER — SODIUM CHLORIDE 0.9% FLUSH
3.0000 mL | Freq: Two times a day (BID) | INTRAVENOUS | Status: DC
  Administered 2021-11-06 – 2021-11-09 (×4): 3 mL via INTRAVENOUS

## 2021-11-06 MED ORDER — ACETAMINOPHEN 325 MG PO TABS
650.0000 mg | ORAL_TABLET | Freq: Once | ORAL | Status: AC
  Administered 2021-11-06: 650 mg via ORAL
  Filled 2021-11-06: qty 2

## 2021-11-06 MED ORDER — ACETAMINOPHEN 650 MG RE SUPP: 650.0000 mg | Freq: Four times a day (QID) | RECTAL | Status: DC | PRN

## 2021-11-06 MED ORDER — SODIUM CHLORIDE 0.9 % IV SOLN
3.0000 g | Freq: Four times a day (QID) | INTRAVENOUS | Status: DC
  Administered 2021-11-06 – 2021-11-09 (×12): 3 g via INTRAVENOUS
  Filled 2021-11-06 (×12): qty 8

## 2021-11-06 MED ORDER — ACETAMINOPHEN 325 MG PO TABS
650.0000 mg | ORAL_TABLET | Freq: Four times a day (QID) | ORAL | Status: DC | PRN
  Administered 2021-11-06 – 2021-11-08 (×4): 650 mg via ORAL
  Filled 2021-11-06 (×4): qty 2

## 2021-11-06 MED ORDER — SODIUM CHLORIDE 0.9 % IV SOLN
3.0000 g | Freq: Once | INTRAVENOUS | Status: AC
  Administered 2021-11-06: 3 g via INTRAVENOUS

## 2021-11-06 MED ORDER — SULFAMETHOXAZOLE-TRIMETHOPRIM 400-80 MG/5ML IV SOLN: 160.0000 mg | Freq: Once | INTRAVENOUS | Status: DC

## 2021-11-06 MED ORDER — ALBUTEROL SULFATE (2.5 MG/3ML) 0.083% IN NEBU
2.5000 mg | INHALATION_SOLUTION | Freq: Four times a day (QID) | RESPIRATORY_TRACT | Status: DC | PRN
Start: 2021-11-06 — End: 2021-11-09

## 2021-11-06 MED ORDER — BUTALBITAL-APAP-CAFFEINE 50-325-40 MG PO TABS
1.0000 | ORAL_TABLET | Freq: Four times a day (QID) | ORAL | Status: DC | PRN
  Administered 2021-11-06 – 2021-11-07 (×2): 1 via ORAL
  Filled 2021-11-06 (×2): qty 1

## 2021-11-06 NOTE — Consult Note (Signed)
[facility]  Reason for Consult: Right breast abscess/fluid collection Referring Physician: Dr. [name] is an 41 y.o. female.  HPI: Patient is a 41 year old female status post bilateral mastectomy with placement of bilateral breast tissue expanders on 10/02/2021.  She was most recently evaluated in the clinic yesterday for right breast redness, nausea.  She was sent home with p.o. Bactrim, subsequently called the on-call provider service and I talked with patient and her friend who was bringing food for her.  Friend noted that patient had a syncopal event, fell to ground.  Patient reported fevers, nausea, significant chills.  She was subsequently evaluated in the ED.  Patient had orthostasis in the ED, WBC of 13.6, blood cultures collected.  She did have a CT scan which showed right breast fluid collection concerning for abscess.  Patient was admitted by medicine team. She has been started on IV antibiotics with first dose yesterday evening.  She has had ongoing fevers with a Tmax at 102.8 this AM.  This a.m. she is accompanied by a friend at bedside, she is tearful, she feels as if the redness to her right breast has worsened.  She continues to have headaches. She denies any cardiac or pulmonary symptoms.  Continues to have nausea.  She has questions about next steps and what to expect moving forward.   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  No other pathogenic variants detected in [facility] Panel.  Report date is Jul 23, 2021.   The BRCAplus panel offered by [employer] and includes sequencing and deletion/duplication analysis for the following 8 genes: ATM, BRCA1, BRCA2, CDH1, CHEK2, PALB2, PTEN, and TP53.  Results  of pan-cancer panel are pending.    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    Past Surgical History:  Procedure Laterality Date   BREAST RECONSTRUCTION WITH PLACEMENT OF TISSUE EXPANDER AND FLEX HD (ACELLULAR HYDRATED DERMIS) Bilateral 10/02/2021   Procedure: BREAST RECONSTRUCTION WITH PLACEMENT OF TISSUE EXPANDER AND FLEX HD (ACELLULAR HYDRATED DERMIS);  Surgeon: Wallace Going, DO;  Location: [facility];  Service: Plastics;  Laterality: Bilateral;   SIMPLE MASTECTOMY WITH AXILLARY SENTINEL NODE BIOPSY Bilateral 10/02/2021   Procedure: SIMPLE MASTECTOMY;  Surgeon: Pollyann Samples, MD;  Location: [facility];  Service: General;  Laterality: Bilateral;    Family History  Problem Relation Age of Onset   Skin cancer Father        face; surgery only   Colon cancer Maternal Aunt        dx before 60?   Cancer Maternal Uncle        larynx; dx after 9   Lung cancer Paternal Aunt        dx after 54   Lung cancer Paternal Uncle        dx after 59   Colon cancer Cousin        dx 57s; paternal female cousin   Ovarian cancer Cousin        dx 5s; paternal cousin    Social History:  reports that she has never smoked. She has never used smokeless tobacco. She reports that she does not drink alcohol and does not use drugs.  Allergies:  Allergies  Allergen Reactions   Other Anaphylaxis and Other (See Comments)    Tree nuts    Medications: I have reviewed the patient's current medications.  Results for orders placed or performed during the hospital encounter of 11/05/21 (from the past 48 hour(s))  Basic metabolic panel     Status: Abnormal   Collection Time: 11/05/21  8:53 PM  Result Value Ref Range   Sodium 136 135 - 145 mmol/L   Potassium 3.7 3.5 - 5.1 mmol/L   Chloride 103 98 - 111 mmol/L   CO2 24 22 - 32 mmol/L   Glucose, Bld 145 (H) 70 - 99 mg/dL    Comment: Glucose reference range applies only to samples taken after  fasting for at least 8 hours.   BUN 8 6 - 20 mg/dL   Creatinine, Ser 0.69 0.44 - 1.00 mg/dL   Calcium 9.2 8.9 - 10.3 mg/dL   GFR, Estimated >60 >60 mL/min    Comment: (NOTE) Calculated using the CKD-EPI Creatinine Equation (2021)    Anion gap 9 5 - 15    Comment: Performed at [facility], [address], [place], [place] [address]  Urinalysis, Routine w reflex microscopic     Status: Abnormal   Collection Time: 11/05/21  8:53 PM  Result Value Ref Range   Color, Urine YELLOW YELLOW   APPearance CLEAR CLEAR   Specific Gravity, Urine 1.010 1.005 - 1.030   pH 6.0 5.0 - 8.0   Glucose, UA NEGATIVE NEGATIVE mg/dL   Hgb urine dipstick MODERATE (A) NEGATIVE   Bilirubin Urine NEGATIVE NEGATIVE   Ketones, ur NEGATIVE NEGATIVE mg/dL   Protein, ur TRACE (A) NEGATIVE mg/dL   Nitrite NEGATIVE NEGATIVE   Leukocytes,Ua TRACE (A) NEGATIVE   RBC / HPF 0-5 0 - 5 RBC/hpf   WBC, UA 6-10 0 - 5 WBC/hpf   Bacteria, UA RARE (A) NONE SEEN   Squamous Epithelial / LPF 0-5 0 - 5   Mucus PRESENT     Comment: Performed at [facility], [address], [place], [place] [address]  Pregnancy, urine     Status: None   Collection Time: 11/05/21  8:53 PM  Result Value Ref Range   Preg Test, Ur NEGATIVE NEGATIVE    Comment:        THE SENSITIVITY OF THIS METHODOLOGY IS >20 mIU/mL. Performed at [facility], [address], [place], [place] [address]   CBC with Differential     Status: Abnormal   Collection Time: 11/05/21  8:53 PM  Result Value Ref Range   WBC 13.6 (H) 4.0 - 10.5 K/uL   RBC 3.64 (L) 3.87 - 5.11 MIL/uL   Hemoglobin 11.7 (L) 12.0 - 15.0 g/dL   HCT 34.4 (L) 36.0 - 46.0 %   MCV 94.5 80.0 - 100.0 fL   MCH 32.1 26.0 - 34.0 pg   MCHC 34.0 30.0 - 36.0 g/dL   RDW 12.8 11.5 - 15.5 %   Platelets 186 150 - 400 K/uL   nRBC 0.0 0.0 - 0.2 %   Neutrophils Relative % 86 %   Neutro Abs 11.7 (H) 1.7 - 7.7 K/uL   Lymphocytes Relative 4 %    Lymphs Abs 0.6 (L) 0.7 - 4.0 K/uL   Monocytes Relative 9 %   Monocytes Absolute 1.2 (H) 0.1 - 1.0 K/uL   Eosinophils Relative 0 %   Eosinophils Absolute 0.0 0.0 - 0.5 K/uL   Basophils Relative 0 %   Basophils Absolute 0.0 0.0 -  0.1 K/uL   Immature Granulocytes 1 %   Abs Immature Granulocytes 0.09 (H) 0.00 - 0.07 K/uL    Comment: Performed at [facility], [address] [employer] [place], [place] [address]  CBG monitoring, ED     Status: Abnormal   Collection Time: 11/05/21  8:57 PM  Result Value Ref Range   Glucose-Capillary 139 (H) 70 - 99 mg/dL    Comment: Glucose reference range applies only to samples taken after fasting for at least 8 hours.  Lactic acid, plasma     Status: None   Collection Time: 11/05/21  9:10 PM  Result Value Ref Range   Lactic Acid, Venous 0.9 0.5 - 1.9 mmol/L    Comment: Performed at [facility], [address] [place], [place] [address]    CT Chest W Contrast  Result Date: 11/06/2021 CLINICAL DATA:  Syncopal episode today, fevers, redness under right breast; mastectomy in July EXAM: CT CHEST WITH CONTRAST TECHNIQUE: Multidetector CT imaging of the chest was performed during intravenous contrast administration. RADIATION DOSE REDUCTION: This exam was performed according to the departmental dose-optimization program which includes automated exposure control, adjustment of the mA and/or kV according to patient size and/or use of iterative reconstruction technique. CONTRAST:  73m OMNIPAQUE IOHEXOL 300 MG/ML  SOLN COMPARISON:  None Available. FINDINGS: Cardiovascular: No significant vascular findings. Normal heart size. No pericardial effusion. Mediastinum/Nodes: No enlarged mediastinal, hilar, or axillary lymph nodes. Thyroid gland, trachea, and esophagus demonstrate no significant findings. Lungs/Pleura: Bibasilar scarring/atelectasis. No pleural effusion or pneumothorax. Upper Abdomen: No acute abnormality. Musculoskeletal:  Bilateral saline breast implants. There is a fluid collection around the right implant extending inferiorly and medially to the implant. The fluid collection demonstrates thick enhancing walls and measures approximately 3.1 x 2.7 x 9.2 cm mild adjacent edema and fat stranding. No acute osseous abnormality. IMPRESSION: Thick-walled peripherally enhancing fluid collection about the right breast implant concerning for abscess. Electronically Signed   By: TPlacido SouM.D.   On: 11/06/2021 01:24    Review of Systems  Constitutional:  Positive for chills, fever and malaise/fatigue.  Cardiovascular:  Negative for leg swelling.  Gastrointestinal:  Positive for nausea and vomiting.  Neurological:  Positive for headaches.   Blood pressure (!) 119/91, pulse (!) 108, temperature (!) 100.4 F (38 C), temperature source Oral, resp. rate 17, height 5' 2"  (1.575 m), weight 61.2 kg, last menstrual period 10/31/2021, SpO2 100 %. Physical Exam Constitutional:      General: She is not in acute distress.    Appearance: She is normal weight. She is ill-appearing.  HENT:     Head: Normocephalic and atraumatic.  Pulmonary:     Effort: Pulmonary effort is normal.  Chest:  Breasts:    Right: Swelling, skin change and tenderness present.     Comments: Right breast erythema noted significant TTP noted. Ecchymosis noted over expander port. Incision appears intact, steri-strips in place. Swelling of R breast  Left breast incision in-tact, steri-strips in place.  Skin:    General: Skin is warm and dry.     Findings: Erythema present.  Neurological:     General: No focal deficit present.     Mental Status: She is alert and oriented to person, place, and time.  Psychiatric:        Mood and Affect: Mood normal.        Behavior: Behavior normal.     Assessment/Plan:  42year old female status post bilateral breast reconstruction with tissue expanders in place bilaterally,  right breast erythema with CT scan  concerning for abscess.  Right breast was aspirated with Dr. Marla Roe, 80 cc of cloudy yellow fluid was aspirated.  Patient tolerated this well.  Will send for culture. Continue with IV Unasyn.  Normal diet today, plan for n.p.o. effective midnight for possible exchange of right breast tissue expander tomorrow with Dr. Marla Roe pending any improvement overnight.  Pain control per primary team. DVT prophylaxis with Lovenox per primary team - hold a.m. dose of Lovenox.  Will notify nursing staff of surgical plans in a.m. Can restart Lovenox if surgery is canceled.  Appreciate assistance of medicine team, we will continue to follow and reevaluate patient tomorrow a.m. to determine need for surgical intervention.  Patient and I discussed options moving forward including improvement with needle aspiration and IV antibiotics, possible need for placement of a drain with radiology, possible exchange of right breast tissue expander with washout of right breast, possible removal of right breast tissue expander and replacement at a later date.  Discussed with patient we will continue to follow and reevaluate in the a.m. to determine need for surgical intervention.  All of her questions were answered to her content.  Pictures were obtained of the patient and placed in the chart with the patient's or guardian's permission.    Shelly Rhine Akiera Allbaugh, PA-C 11/06/2021, 11:57 AM

## 2021-11-06 NOTE — Progress Notes (Signed)
Transferring facility: [facility] Requesting provider: Dr. Leonette Monarch (EDP at [facility]) Reason for transfer: admission for further evaluation and management of sepsis due to suspected right breast abscess.   41 year old female with medical history notable for breast cancer status post bilateral mastectomy with spacer placement in July 2023, who presented to [facility] ED on 11/05/2021 complaining of a single episode of syncope.  The patient reports that she experienced a single episode of loss of consciousness shortly after she had stood up from the sofa at home and started to ambulate to the bathroom at which time she developed lightheadedness, dizziness, nausea without vomiting, as well as the sensation of impending loss of consciousness before losing consciousness and falling backwards onto the floor. Does not believe that she hit head.  No associated any chest pain or shortness of breath.  Not on any blood thinners.  In the setting of a history of breast cancer, she had undergone bilateral mastectomy with spacer placement in July 2023 via the office of Dr. Marla Roe of Plastic surgery.  Over the last several days, pt has noticed progressive erythema/swelling of the right breast, a/w subjective fever.  She had contacted the office of Dr. Marla Roe regarding these changes, with plan to initiate oral Bactrim, although it does not appear that the patient has yet been able to start the Bactrim.  However, following the above episode of syncope, she presented to [facility] ED for further evaluation/management thereof.  Vital signs in the ED were notable for the following: Temp max 99.6; heart rate 88-107.  Orthostatic vital signs were checked, during which time the patient became symptomatic, with dizziness/lightheadedness.   Labs were notable for CBC showing wbc of 13,600.  Lactic acid 0.9.  Blood cultures x2 collected.    Imaging notable for CT chest w/ contrast, which showed right breast fluid collection  concerning for abscess.   EDP at [facility] discussed patient's case/imaging with Roetta Sessions, PA, who works with Dr. Marla Roe of Plastic surgery. Rodman Key Scheeler requested that the patient be admitted to the hospitalistservice at [facility], and conveyed that Dr. Eusebio Friendly office will formally consult/see the patient at [facility] in the AM, with preliminary plan to attempt aspiration of right breast fluid collection, with potential for escalation to formal I&D.   Medications administered prior to transfer included the following: Normal saline x1 L bolus, acetaminophen, Unasyn. Of note, IV Bactrim not available at [facility].    Subsequently, I accepted this patient for transfer for inpatient admission to a med-tele bed at [facility] for further work-up and management of sepsis due to suspected right breast abscess.      Check [email or web address] for on-call coverage.   Nursing staff, Please call [place] number on Amion as soon as patient's arrival, so appropriate admitting provider can evaluate the pt.     Babs Bertin, DO Hospitalist

## 2021-11-06 NOTE — H&P (View-Only) (Signed)
[facility]  Reason for Consult: Right breast abscess/fluid collection Referring Physician: Dr. Diego Hensley is an 41 y.o. female.  HPI: Patient is a 41 year old female status post bilateral mastectomy with placement of bilateral breast tissue expanders on 10/02/2021.  She was most recently evaluated in the clinic yesterday for right breast redness, nausea.  She was sent home with p.o. Bactrim, subsequently called the on-call provider service and I talked with patient and her friend who was bringing food for her.  Friend noted that patient had a syncopal event, fell to ground.  Patient reported fevers, nausea, significant chills.  She was subsequently evaluated in the ED.  Patient had orthostasis in the ED, WBC of 13.6, blood cultures collected.  She did have a CT scan which showed right breast fluid collection concerning for abscess.  Patient was admitted by medicine team. She has been started on IV antibiotics with first dose yesterday evening.  She has had ongoing fevers with a Tmax at 102.8 this AM.  This a.m. she is accompanied by a friend at bedside, she is tearful, she feels as if the redness to her right breast has worsened.  She continues to have headaches. She denies any cardiac or pulmonary symptoms.  Continues to have nausea.  She has questions about next steps and what to expect moving forward.   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  No other pathogenic variants detected in [facility] Panel.  Report date is Jul 23, 2021.   The BRCAplus panel offered by [employer] and includes sequencing and deletion/duplication analysis for the following 8 genes: ATM, BRCA1, BRCA2, CDH1, CHEK2, PALB2, PTEN, and TP53.  Results  of pan-cancer panel are pending.    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    Past Surgical History:  Procedure Laterality Date   BREAST RECONSTRUCTION WITH PLACEMENT OF TISSUE EXPANDER AND FLEX HD (ACELLULAR HYDRATED DERMIS) Bilateral 10/02/2021   Procedure: BREAST RECONSTRUCTION WITH PLACEMENT OF TISSUE EXPANDER AND FLEX HD (ACELLULAR HYDRATED DERMIS);  Surgeon: Wallace Going, DO;  Location: [place];  Service: Plastics;  Laterality: Bilateral;   SIMPLE MASTECTOMY WITH AXILLARY SENTINEL NODE BIOPSY Bilateral 10/02/2021   Procedure: SIMPLE MASTECTOMY;  Surgeon: Pollyann Samples, MD;  Location: [place];  Service: General;  Laterality: Bilateral;    Family History  Problem Relation Age of Onset   Skin cancer Father        face; surgery only   Colon cancer Maternal Aunt        dx before 60?   Cancer Maternal Uncle        larynx; dx after 7   Lung cancer Paternal Aunt        dx after 79   Lung cancer Paternal Uncle        dx after 16   Colon cancer Cousin        dx 44s; paternal female cousin   Ovarian cancer Cousin        dx 30s; paternal cousin    Social History:  reports that she has never smoked. She has never used smokeless tobacco. She reports that she does not drink alcohol and does not use drugs.  Allergies:  Allergies  Allergen Reactions   Other Anaphylaxis and Other (See Comments)    Tree nuts    Medications: I have reviewed the patient's current medications.  Results for orders placed or performed during the hospital encounter of 11/05/21 (from the past 48 hour(s))  Basic metabolic panel     Status: Abnormal   Collection Time: 11/05/21  8:53 PM  Result Value Ref Range   Sodium 136 135 - 145 mmol/L   Potassium 3.7 3.5 - 5.1 mmol/L   Chloride 103 98 - 111 mmol/L   CO2 24 22 - 32 mmol/L   Glucose, Bld 145 (H) 70 - 99 mg/dL    Comment: Glucose reference range applies only to samples taken after  fasting for at least 8 hours.   BUN 8 6 - 20 mg/dL   Creatinine, Ser 0.69 0.44 - 1.00 mg/dL   Calcium 9.2 8.9 - 10.3 mg/dL   GFR, Estimated >60 >60 mL/min    Comment: (NOTE) Calculated using the CKD-EPI Creatinine Equation (2021)    Anion gap 9 5 - 15    Comment: Performed at [facility], [address] [place], [place] [address]  Urinalysis, Routine w reflex microscopic     Status: Abnormal   Collection Time: 11/05/21  8:53 PM  Result Value Ref Range   Color, Urine YELLOW YELLOW   APPearance CLEAR CLEAR   Specific Gravity, Urine 1.010 1.005 - 1.030   pH 6.0 5.0 - 8.0   Glucose, UA NEGATIVE NEGATIVE mg/dL   Hgb urine dipstick MODERATE (A) NEGATIVE   Bilirubin Urine NEGATIVE NEGATIVE   Ketones, ur NEGATIVE NEGATIVE mg/dL   Protein, ur TRACE (A) NEGATIVE mg/dL   Nitrite NEGATIVE NEGATIVE   Leukocytes,Ua TRACE (A) NEGATIVE   RBC / HPF 0-5 0 - 5 RBC/hpf   WBC, UA 6-10 0 - 5 WBC/hpf   Bacteria, UA RARE (A) NONE SEEN   Squamous Epithelial / LPF 0-5 0 - 5   Mucus PRESENT     Comment: Performed at [facility], [address], [place], [place] [address]  Pregnancy, urine     Status: None   Collection Time: 11/05/21  8:53 PM  Result Value Ref Range   Preg Test, Ur NEGATIVE NEGATIVE    Comment:        THE SENSITIVITY OF THIS METHODOLOGY IS >20 mIU/mL. Performed at [facility], [address], [place],  [address]   CBC with Differential     Status: Abnormal   Collection Time: 11/05/21  8:53 PM  Result Value Ref Range   WBC 13.6 (H) 4.0 - 10.5 K/uL   RBC 3.64 (L) 3.87 - 5.11 MIL/uL   Hemoglobin 11.7 (L) 12.0 - 15.0 g/dL   HCT 34.4 (L) 36.0 - 46.0 %   MCV 94.5 80.0 - 100.0 fL   MCH 32.1 26.0 - 34.0 pg   MCHC 34.0 30.0 - 36.0 g/dL   RDW 12.8 11.5 - 15.5 %   Platelets 186 150 - 400 K/uL   nRBC 0.0 0.0 - 0.2 %   Neutrophils Relative % 86 %   Neutro Abs 11.7 (H) 1.7 - 7.7 K/uL   Lymphocytes Relative 4 %    Lymphs Abs 0.6 (L) 0.7 - 4.0 K/uL   Monocytes Relative 9 %   Monocytes Absolute 1.2 (H) 0.1 - 1.0 K/uL   Eosinophils Relative 0 %   Eosinophils Absolute 0.0 0.0 - 0.5 K/uL   Basophils Relative 0 %   Basophils Absolute 0.0 0.0 -  0.1 K/uL   Immature Granulocytes 1 %   Abs Immature Granulocytes 0.09 (H) 0.00 - 0.07 K/uL    Comment: Performed at [facility], [address], [place], [place] [address]  CBG monitoring, ED     Status: Abnormal   Collection Time: 11/05/21  8:57 PM  Result Value Ref Range   Glucose-Capillary 139 (H) 70 - 99 mg/dL    Comment: Glucose reference range applies only to samples taken after fasting for at least 8 hours.  Lactic acid, plasma     Status: None   Collection Time: 11/05/21  9:10 PM  Result Value Ref Range   Lactic Acid, Venous 0.9 0.5 - 1.9 mmol/L    Comment: Performed at [facility], [address] [place], [place] [address]    CT Chest W Contrast  Result Date: 11/06/2021 CLINICAL DATA:  Syncopal episode today, fevers, redness under right breast; mastectomy in July EXAM: CT CHEST WITH CONTRAST TECHNIQUE: Multidetector CT imaging of the chest was performed during intravenous contrast administration. RADIATION DOSE REDUCTION: This exam was performed according to the departmental dose-optimization program which includes automated exposure control, adjustment of the mA and/or kV according to patient size and/or use of iterative reconstruction technique. CONTRAST:  66m OMNIPAQUE IOHEXOL 300 MG/ML  SOLN COMPARISON:  None Available. FINDINGS: Cardiovascular: No significant vascular findings. Normal heart size. No pericardial effusion. Mediastinum/Nodes: No enlarged mediastinal, hilar, or axillary lymph nodes. Thyroid gland, trachea, and esophagus demonstrate no significant findings. Lungs/Pleura: Bibasilar scarring/atelectasis. No pleural effusion or pneumothorax. Upper Abdomen: No acute abnormality. Musculoskeletal:  Bilateral saline breast implants. There is a fluid collection around the right implant extending inferiorly and medially to the implant. The fluid collection demonstrates thick enhancing walls and measures approximately 3.1 x 2.7 x 9.2 cm mild adjacent edema and fat stranding. No acute osseous abnormality. IMPRESSION: Thick-walled peripherally enhancing fluid collection about the right breast implant concerning for abscess. Electronically Signed   By: TPlacido SouM.D.   On: 11/06/2021 01:24    Review of Systems  Constitutional:  Positive for chills, fever and malaise/fatigue.  Cardiovascular:  Negative for leg swelling.  Gastrointestinal:  Positive for nausea and vomiting.  Neurological:  Positive for headaches.   Blood pressure (!) 119/91, pulse (!) 108, temperature (!) 100.4 F (38 C), temperature source Oral, resp. rate 17, height 5' 2"  (1.575 m), weight 61.2 kg, last menstrual period 10/31/2021, SpO2 100 %. Physical Exam Constitutional:      General: She is not in acute distress.    Appearance: She is normal weight. She is ill-appearing.  HENT:     Head: Normocephalic and atraumatic.  Pulmonary:     Effort: Pulmonary effort is normal.  Chest:  Breasts:    Right: Swelling, skin change and tenderness present.     Comments: Right breast erythema noted significant TTP noted. Ecchymosis noted over expander port. Incision appears intact, steri-strips in place. Swelling of R breast  Left breast incision in-tact, steri-strips in place.  Skin:    General: Skin is warm and dry.     Findings: Erythema present.  Neurological:     General: No focal deficit present.     Mental Status: She is alert and oriented to person, place, and time.  Psychiatric:        Mood and Affect: Mood normal.        Behavior: Behavior normal.     Assessment/Plan:  41year old female status post bilateral breast reconstruction with tissue expanders in place bilaterally,  right breast erythema with CT scan  concerning for abscess.  Right breast was aspirated with Dr. Marla Roe, 80 cc of cloudy yellow fluid was aspirated.  Patient tolerated this well.  Will send for culture. Continue with IV Unasyn.  Normal diet today, plan for n.p.o. effective midnight for possible exchange of right breast tissue expander tomorrow with Dr. Marla Roe pending any improvement overnight.  Pain control per primary team. DVT prophylaxis with Lovenox per primary team - hold a.m. dose of Lovenox.  Will notify nursing staff of surgical plans in a.m. Can restart Lovenox if surgery is canceled.  Appreciate assistance of medicine team, we will continue to follow and reevaluate patient tomorrow a.m. to determine need for surgical intervention.  Patient and I discussed options moving forward including improvement with needle aspiration and IV antibiotics, possible need for placement of a drain with radiology, possible exchange of right breast tissue expander with washout of right breast, possible removal of right breast tissue expander and replacement at a later date.  Discussed with patient we will continue to follow and reevaluate in the a.m. to determine need for surgical intervention.  All of her questions were answered to her content.  Pictures were obtained of the patient and placed in the chart with the patient's or guardian's permission.    Shelly Rhine Leib Elahi, PA-C 11/06/2021, 11:57 AM

## 2021-11-06 NOTE — Progress Notes (Signed)
   11/06/21 1314  Assess: MEWS Score  Temp 99.8 F (37.7 C)  BP 118/79  MAP (mmHg) 90  Pulse Rate (!) 116  Resp 18  SpO2 98 %  O2 Device Room Air  Assess: MEWS Score  MEWS Temp 0  MEWS Systolic 0  MEWS Pulse 2  MEWS RR 0  MEWS LOC 0  MEWS Score 2  MEWS Score Color Yellow  Assess: if the MEWS score is Yellow or Red  Were vital signs taken at a resting state? Yes  Focused Assessment No change from prior assessment  Does the patient meet 2 or more of the SIRS criteria? Yes  Does the patient have a confirmed or suspected source of infection? Yes  Provider and Rapid Response Notified? Yes  MEWS guidelines implemented *See Row Information* Yes  Treat  MEWS Interventions Administered prn meds/treatments;Administered scheduled meds/treatments  Take Vital Signs  Increase Vital Sign Frequency  Yellow: Q 2hr X 2 then Q 4hr X 2, if remains yellow, continue Q 4hrs  Escalate  MEWS: Escalate Yellow: discuss with charge nurse/RN and consider discussing with provider and RRT  Notify: Charge Nurse/RN  Name of Charge Nurse/RN Notified Remo Lipps RN  Date Charge Nurse/RN Notified 11/06/21  Time Charge Nurse/RN Notified 1330  Notify: Provider  Provider Name/Title Tamala Julian  Date Provider Notified 11/06/21  Time Provider Notified 1330  Method of Notification Page  Notification Reason  (yellow MEWS)  Provider response See new orders  Document  Patient Outcome Stabilized after interventions  Progress note created (see row info) Yes  Assess: SIRS CRITERIA  SIRS Temperature  0  SIRS Pulse 1  SIRS Respirations  0  SIRS WBC 1  SIRS Score Sum  2

## 2021-11-06 NOTE — ED Notes (Signed)
Family updated as to patient's status.

## 2021-11-06 NOTE — H&P (Addendum)
History and Physical    Patient: Shelly Hensley RDE:[id number] DOB: Aug 13, 1980 DOA: 11/05/2021 DOS: the patient was seen and examined on 11/06/2021 PCP: Marco Collie, MD  Patient coming from: [place] transfer  Chief Complaint:  Chief Complaint  Patient presents with   syncopal episode   HPI: Shelly Hensley is a 41 y.o. female with medical history significant of  for breast cancer status post bilateral mastectomy with spacer placement in July 2023, who presented to [facility] ED on 11/05/2021 after having syncopal episode.  3 days ago she had developed redness and irritation along the lower lateral aspect of the right breast that she initially related to her bra rubbing.  The following day she felt awful and started developing fevers which she reported up to 100 F at home.  She been alternating Tylenol and Motrin at home to keep the fevers down.  She reported associated symptoms of nausea with decreased p.o. intake, but denied having any vomiting. She followed up in the plastics clinic yesterday due to the redness of the right breast given her recent surgery.  At that time they prescribed Bactrim,,but did not get to take any of this medication.  After getting home while trying to ambulate to the bathroom she developed lightheadedness, dizziness, and felt like she was going to pass out prior to doing so.  She reportedly fell backwards, but did not report hitting her head.  Patient had denied any complaints of shortness of breath or chest pain.  She still feels   Vital signs in the ED were notable for the following: Temperature elevated up to 102.8 F and heart rates elevated up to 122.  Orthostatic vital signs were checked and noted to be positive with reoccurrence of symptoms.Labs from 8/14 were notable for CBC showing wbc of 13,600, hemoglobin 11.7, and lactic acid 0.9.  Urinalysis noted trace leukocytes with rare bacteria, and 6-10 WBCs.  Blood cultures x2 collected.  CT of the chest showed right  breast fluid collection concerning for abscess. EDP at [facility] discussed patient's case/imaging with Roetta Sessions, PA, who works with Dr. Marla Roe of Plastic surgery with preliminary plan to attempt aspiration of right breast fluid collection, with potential for escalation to formal I&D.    Review of Systems: As mentioned in the history of present illness. All other systems reviewed and are negative. 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  No other pathogenic variants detected in [facility] Panel.  Report date is Jul 23, 2021.   The BRCAplus panel offered by [employer] and includes sequencing and deletion/duplication analysis for the following 8 genes: ATM, BRCA1, BRCA2, CDH1, CHEK2, PALB2, PTEN, and TP53.  Results of pan-cancer panel are pending.    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   Past Surgical History:  Procedure Laterality Date   BREAST RECONSTRUCTION WITH PLACEMENT OF TISSUE EXPANDER AND FLEX HD (ACELLULAR HYDRATED DERMIS) Bilateral 10/02/2021   Procedure: BREAST RECONSTRUCTION WITH PLACEMENT OF TISSUE EXPANDER AND FLEX HD (ACELLULAR HYDRATED DERMIS);  Surgeon: Wallace Going, DO;  Location: [place];  Service: Plastics;  Laterality: Bilateral;   SIMPLE MASTECTOMY WITH AXILLARY SENTINEL NODE BIOPSY Bilateral 10/02/2021   Procedure: SIMPLE MASTECTOMY;  Surgeon: Pollyann Samples, MD;  Location: [facility];  Service: General;  Laterality: Bilateral;   Social History:  reports that she has never smoked. She has never used smokeless tobacco. She reports that she does not drink alcohol and does not use drugs.  Allergies  Allergen Reactions   Other Anaphylaxis and Other (See Comments)     Tree nuts    Family History  Problem Relation Age of Onset   Skin cancer Father        face; surgery only   Colon cancer Maternal Aunt        dx before 5?   Cancer Maternal Uncle        larynx; dx after 75   Lung cancer Paternal Aunt        dx after 43   Lung cancer Paternal Uncle        dx after 89   Colon cancer Cousin        dx 41s; paternal female cousin   Ovarian cancer Cousin        dx 79s; paternal cousin    Prior to Admission medications   Medication Sig Start Date End Date Taking? Authorizing Provider  Aloe Vera 25 MG CAPS Take 1 capsule by mouth daily.    [provider]  Cholecalciferol 100 MCG (4000 UT) CAPS Take 1 tablet by mouth daily.    [provider]  diazepam (VALIUM) 2 MG tablet Take 1 tablet (2 mg total) by mouth every 12 (twelve) hours as needed for muscle spasms. 10/03/21   Scheeler, Carola Rhine, PA-C  Lactobacillus (PROBIOTIC ACIDOPHILUS PO) Take by mouth. Takes it daily at this time    [provider]  loratadine (CLARITIN) 10 MG tablet  08/26/17   [provider]  ondansetron (ZOFRAN) 4 MG tablet Take 1 tablet (4 mg total) by mouth every 8 (eight) hours as needed for nausea or vomiting. 09/26/21   Scheeler, Carola Rhine, PA-C  OVER THE COUNTER MEDICATION Juice Plus-Taking daily.    [provider]  sulfamethoxazole-trimethoprim (BACTRIM DS) 800-160 MG tablet Take 1 tablet by mouth 2 (two) times daily. 11/05/21   Hedges, Dellis Filbert, PA-C  valACYclovir (VALTREX) 1000 MG tablet Take 2,000 mg by mouth 2 (two) times daily as needed. As needed for fever blisters 05/21/21   [provider]    Physical Exam: Vitals:   11/06/21 0715 11/06/21 0853 11/06/21 0931 11/06/21 1044  BP: 125/83 118/80 129/89 (!) 119/91  Pulse: (!) 113 (!) 122 (!) 111 (!) 108  Resp: 19 (!) 21 18 17   Temp:  (!) 102.8 F (39.3 C) (!) 100.7 F (38.2 C) (!) 100.4 F (38 C)  TempSrc:  Oral Oral Oral  SpO2: 97% 96% 97% 100%  Weight:      Height:        Exam  Constitutional: Elderly female who appears to be ill but in no acute distress Eyes: PERRL, lids and conjunctivae normal ENMT: Mucous membranes are moist. Posterior pharynx clear of any exudate or lesions.  Neck: normal, supple.  Possible 1 cm cyst noted on theright-hand side of neck Respiratory: clear to auscultation bilaterally, no wheezing, no crackles. Normal respiratory effort. No accessory muscle use.  Cardiovascular: Tachycardic, no murmurs / rubs / gallops. Breast exam: Deferred at bedside.  Reviewed images from of visit 8/14 showing erythema of the inferior lateral aspect of the right breast. Abdomen: no tenderness, no masses palpated. Bowel sounds positive.  Musculoskeletal: no clubbing / cyanosis. No joint deformity upper and lower extremities.  Skin: no rashes,  lesions, ulcers. No induration Neurologic: CN 2-12 grossly intact.  Strength 5/5 in all 4.  Psychiatric: Normal judgment and insight. Alert and oriented x 3. Normal mood.   Data Reviewed:  EKG revealed normal sinus rhythm at 91 bpm  Assessment and Plan:  Sepsis secondary to breast abscess Acute.  Patient was noted to be febrile up to 102.8 F with tachycardia meeting SIRS criteria.  WBC elevated at 13.6, but lactic acid was reassuring at 0.9.  CT imaging noted a 3.1 x 2.7 x 9.2 cm abscess about the right breast implant.  Dr. Marla Roe of plastic surgery.  Patient has been started on empiric antibiotics of Unasyn.  They had performed a bedside aspiration after patient was transferred to the hospital. -Admit to a medical telemetry bed -Follow-up blood culture -Body fluid for culture and Gram stain -Continue empiric antibiotics of Unasyn  -Tylenol/ibuprofen as needed for pain/fever -Recheck CBC tomorrow morning -N.p.o. after midnight in case of possible need of procedure -Appreciate plastics consultative services,  will follow-up for any further recommendations  Syncope secondary to orthostatic  hypotension Patient was noted to have positive orthostatics.  Suspect likely secondary to patient being acutely dehydrated in the setting of acute infection with poor p.o. intake. -Normal saline IV fluids at 75 mL/h -Recheck orthostatic vital signs  Leukocytosis Acute.  WBC elevated at 13.6.  Suspect secondary to above. -Recheck CBC tomorrow morning  Abnormal urinalysis Urinalysis noted moderate hemoglobin, trace leukocytes, rare bacteria, 6-10 WBCs.  Patient had not reported any complaints of dysuria.  No urine culture have been obtained. -Antibiotics as noted above  Normocytic anemia Hemoglobin 11.7 g/dL.  Patient did not report any complaints of bleeding. -Recheck CBC tomorrow morning  History of ductal carcinoma in situ Patient noted to have ductal carcinoma in situ of the left breast status post bilateral mastectomies and immediate reconstruction with placement of tissue expanders in 09/2021. -Continue outpatient follow-up with oncology  Advance Care Planning:   Code Status: Full Code   Consults: Plastic surgery  Family Communication: Husband and family updated at bedside Severity of Illness: The appropriate patient status for this patient is INPATIENT. Inpatient status is judged to be reasonable and necessary in order to provide the required intensity of service to ensure the patient's safety. The patient's presenting symptoms, physical exam findings, and initial radiographic and laboratory data in the context of their chronic comorbidities is felt to place them at high risk for further clinical deterioration. Furthermore, it is not anticipated that the patient will be medically stable for discharge from the hospital within 2 midnights of admission.   * I certify that at the point of admission it is my clinical judgment that the patient will require inpatient hospital care spanning beyond 2 midnights from the point of admission due to high intensity of service, high risk for further  deterioration and high frequency of surveillance required.*  Author: Norval Morton, MD 11/06/2021 10:49 AM  For on call review [email or web address].

## 2021-11-06 NOTE — ED Notes (Signed)
Family updated as to patient's status. Spouse aware pt is being transported to [place] via [place]. Directions and address given

## 2021-11-06 NOTE — Progress Notes (Signed)
Pharmacy Antibiotic Note  Shelly Hensley is a 41 y.o. female admitted on 11/05/2021 with  R breast abscess .  Pharmacy has been consulted for Unasyn (ampicillin/sulbactam) dosing.  Patient received Unasyn 3g x1 in ED at 03:01 on 8/15 WBC 13.6, Tmax 100.4, HR >100, SCr 0.69  Plan: Start  Unasyn 3g IV q6h Monitor daily WBC, temp, SCr, and for clinical s/sx of infection F/u cultures and de-escalate antibiotics as able  Height: '5\' 2"'$  (157.5 cm) Weight: 61.2 kg (135 lb) IBW/kg (Calculated) : 50.1  Temp (24hrs), Avg:100 F (37.8 C), Min:98.2 F (36.8 C), Max:102.8 F (39.3 C)  Recent Labs  Lab 11/05/21 2053 11/05/21 2110  WBC 13.6*  --   CREATININE 0.69  --   LATICACIDVEN  --  0.9    Estimated Creatinine Clearance: 79.6 mL/min (by C-G formula based on SCr of 0.69 mg/dL).    Allergies  Allergen Reactions   Other Anaphylaxis and Other (See Comments)    Tree nuts    Antimicrobials this admission: Unsayn 8/15 >>   Dose adjustments this admission: N/A  Microbiology results: 8/15 BCx: in process  Thank you for allowing pharmacy to be a part of this patient's care.  Luisa Hart, PharmD, BCPS Clinical Pharmacist 11/06/2021 12:01 PM   Please refer to [facility] for pharmacy phone number

## 2021-11-07 ENCOUNTER — Encounter (HOSPITAL_COMMUNITY)
Admission: EM | Disposition: A | Discharge status: Home / Self Care | Payer: Self-pay | Source: Home / Self Care | Attending: Family Medicine

## 2021-11-07 ENCOUNTER — Inpatient Hospital Stay (HOSPITAL_COMMUNITY): Payer: Commercial Managed Care - PPO | Admitting: Anesthesiology

## 2021-11-07 ENCOUNTER — Other Ambulatory Visit: Payer: Self-pay

## 2021-11-07 DIAGNOSIS — Z719 Counseling, unspecified: Secondary | ICD-10-CM

## 2021-11-07 DIAGNOSIS — L7634 Postprocedural seroma of skin and subcutaneous tissue following other procedure: Secondary | ICD-10-CM

## 2021-11-07 DIAGNOSIS — N611 Abscess of the breast and nipple: Secondary | ICD-10-CM | POA: Diagnosis not present

## 2021-11-07 HISTORY — PX: REMOVAL OF TISSUE EXPANDER: SHX6324

## 2021-11-07 LAB — CBC
HCT: 28.9 % — ABNORMAL LOW (ref 36.0–46.0)
Hemoglobin: 9.5 g/dL — ABNORMAL LOW (ref 12.0–15.0)
MCH: 31.5 pg (ref 26.0–34.0)
MCHC: 32.9 g/dL (ref 30.0–36.0)
MCV: 95.7 fL (ref 80.0–100.0)
Platelets: 121 10*3/uL — ABNORMAL LOW (ref 150–400)
RBC: 3.02 MIL/uL — ABNORMAL LOW (ref 3.87–5.11)
RDW: 12.8 % (ref 11.5–15.5)
WBC: 5.6 10*3/uL (ref 4.0–10.5)
nRBC: 0 % (ref 0.0–0.2)

## 2021-11-07 LAB — BASIC METABOLIC PANEL
Anion gap: 6 (ref 5–15)
BUN: 7 mg/dL (ref 6–20)
CO2: 24 mmol/L (ref 22–32)
Calcium: 8.2 mg/dL — ABNORMAL LOW (ref 8.9–10.3)
Chloride: 107 mmol/L (ref 98–111)
Creatinine, Ser: 0.68 mg/dL (ref 0.44–1.00)
GFR, Estimated: >60 mL/min (ref >60)
Glucose, Bld: 117 mg/dL — ABNORMAL HIGH (ref 70–99)
Potassium: 3.4 mmol/L — ABNORMAL LOW (ref 3.5–5.1)
Sodium: 137 mmol/L (ref 135–145)

## 2021-11-07 SURGERY — REMOVAL, TISSUE EXPANDER
Anesthesia: General | Site: Breast | Laterality: Right

## 2021-11-07 MED ORDER — SCOPOLAMINE 1 MG/3DAYS TD PT72
MEDICATED_PATCH | TRANSDERMAL | Status: AC
  Administered 2021-11-07: 1.5 mg via TRANSDERMAL
  Filled 2021-11-07: qty 1

## 2021-11-07 MED ORDER — AMISULPRIDE (ANTIEMETIC) 5 MG/2ML IV SOLN
10.0000 mg | Freq: Once | INTRAVENOUS | Status: DC | PRN
Start: 2021-11-07 — End: 2021-11-07

## 2021-11-07 MED ORDER — ACETAMINOPHEN 10 MG/ML IV SOLN
INTRAVENOUS | Status: DC | PRN
  Administered 2021-11-07: 1000 mg via INTRAVENOUS

## 2021-11-07 MED ORDER — CHLORHEXIDINE GLUCONATE CLOTH 2 % EX PADS
6.0000 | MEDICATED_PAD | Freq: Once | CUTANEOUS | Status: AC
  Administered 2021-11-07: 6 via TOPICAL

## 2021-11-07 MED ORDER — ONDANSETRON HCL 4 MG/2ML IJ SOLN: 4.0000 mg | Freq: Once | INTRAMUSCULAR | Status: DC | PRN

## 2021-11-07 MED ORDER — RISAQUAD PO CAPS
1.0000 | ORAL_CAPSULE | Freq: Every day | ORAL | Status: DC
  Administered 2021-11-08 – 2021-11-09 (×2): 1 via ORAL
  Filled 2021-11-07 (×2): qty 1

## 2021-11-07 MED ORDER — MIDAZOLAM HCL 2 MG/2ML IJ SOLN
INTRAMUSCULAR | Status: DC | PRN
  Administered 2021-11-07: 2 mg via INTRAVENOUS

## 2021-11-07 MED ORDER — FENTANYL CITRATE (PF) 250 MCG/5ML IJ SOLN
INTRAMUSCULAR | Status: AC
  Filled 2021-11-07: qty 5

## 2021-11-07 MED ORDER — CEFAZOLIN SODIUM-DEXTROSE 2-3 GM-%(50ML) IV SOLR
INTRAVENOUS | Status: DC | PRN
  Administered 2021-11-07: 2 g via INTRAVENOUS

## 2021-11-07 MED ORDER — OXYCODONE HCL 5 MG PO TABS: 5.0000 mg | ORAL_TABLET | Freq: Once | ORAL | Status: DC | PRN

## 2021-11-07 MED ORDER — FENTANYL CITRATE (PF) 250 MCG/5ML IJ SOLN
INTRAMUSCULAR | Status: DC | PRN
  Administered 2021-11-07 (×3): 50 ug via INTRAVENOUS

## 2021-11-07 MED ORDER — ONDANSETRON HCL 4 MG/2ML IJ SOLN
INTRAMUSCULAR | Status: DC | PRN
  Administered 2021-11-07: 4 mg via INTRAVENOUS

## 2021-11-07 MED ORDER — ACETAMINOPHEN 500 MG PO TABS
1000.0000 mg | ORAL_TABLET | Freq: Once | ORAL | Status: DC
  Filled 2021-11-07: qty 2

## 2021-11-07 MED ORDER — LACTATED RINGERS IV SOLN: INTRAVENOUS | Status: DC

## 2021-11-07 MED ORDER — DROPERIDOL 2.5 MG/ML IJ SOLN
INTRAMUSCULAR | Status: DC | PRN
  Administered 2021-11-07: .625 mg via INTRAVENOUS

## 2021-11-07 MED ORDER — CEFAZOLIN SODIUM-DEXTROSE 2-4 GM/100ML-% IV SOLN
INTRAVENOUS | Status: AC
  Filled 2021-11-07: qty 100

## 2021-11-07 MED ORDER — SCOPOLAMINE 1 MG/3DAYS TD PT72: 1.0000 | MEDICATED_PATCH | TRANSDERMAL | Status: DC

## 2021-11-07 MED ORDER — 0.9 % SODIUM CHLORIDE (POUR BTL) OPTIME
TOPICAL | Status: DC | PRN
  Administered 2021-11-07: 1000 mL

## 2021-11-07 MED ORDER — CHLORHEXIDINE GLUCONATE CLOTH 2 % EX PADS
6.0000 | MEDICATED_PAD | Freq: Once | CUTANEOUS | Status: AC
Start: 2021-11-07 — End: 2021-11-07
  Administered 2021-11-07: 6 via TOPICAL

## 2021-11-07 MED ORDER — FENTANYL CITRATE (PF) 100 MCG/2ML IJ SOLN: 25.0000 ug | INTRAMUSCULAR | Status: DC | PRN

## 2021-11-07 MED ORDER — MIDAZOLAM HCL 2 MG/2ML IJ SOLN
INTRAMUSCULAR | Status: AC
  Filled 2021-11-07: qty 2

## 2021-11-07 MED ORDER — SODIUM CHLORIDE 0.9 % IV SOLN
Freq: Once | INTRAVENOUS | Status: AC
  Administered 2021-11-07: 500 mL
  Filled 2021-11-07: qty 10

## 2021-11-07 MED ORDER — SCOPOLAMINE 1 MG/3DAYS TD PT72
1.0000 | MEDICATED_PATCH | TRANSDERMAL | Status: DC
Start: 2021-11-07 — End: 2021-11-07

## 2021-11-07 MED ORDER — LIDOCAINE 2% (20 MG/ML) 5 ML SYRINGE
INTRAMUSCULAR | Status: DC | PRN
  Administered 2021-11-07: 100 mg via INTRAVENOUS

## 2021-11-07 MED ORDER — PROPOFOL 500 MG/50ML IV EMUL
INTRAVENOUS | Status: DC | PRN
  Administered 2021-11-07: 150 ug/kg/min via INTRAVENOUS

## 2021-11-07 MED ORDER — DEXMEDETOMIDINE HCL IN NACL 200 MCG/50ML IV SOLN
INTRAVENOUS | Status: DC | PRN
  Administered 2021-11-07: 8 ug via INTRAVENOUS
  Administered 2021-11-07: 12 ug via INTRAVENOUS

## 2021-11-07 MED ORDER — ORAL CARE MOUTH RINSE
15.0000 mL | Freq: Once | OROMUCOSAL | Status: AC
Start: 2021-11-07 — End: 2021-11-07

## 2021-11-07 MED ORDER — CEFAZOLIN SODIUM-DEXTROSE 2-4 GM/100ML-% IV SOLN: 2.0000 g | INTRAVENOUS | Status: DC

## 2021-11-07 MED ORDER — CLOTRIMAZOLE 1 % VA CREA
1.0000 | TOPICAL_CREAM | Freq: Every day | VAGINAL | Status: DC
  Administered 2021-11-07 – 2021-11-08 (×2): 1 via VAGINAL
  Filled 2021-11-07 (×2): qty 45

## 2021-11-07 MED ORDER — DEXAMETHASONE SODIUM PHOSPHATE 10 MG/ML IJ SOLN
INTRAMUSCULAR | Status: DC | PRN
  Administered 2021-11-07: 10 mg via INTRAVENOUS

## 2021-11-07 MED ORDER — PROPOFOL 10 MG/ML IV BOLUS
INTRAVENOUS | Status: DC | PRN
  Administered 2021-11-07: 150 mg via INTRAVENOUS

## 2021-11-07 MED ORDER — CHLORHEXIDINE GLUCONATE 0.12 % MT SOLN
15.0000 mL | Freq: Once | OROMUCOSAL | Status: AC
  Administered 2021-11-07: 15 mL via OROMUCOSAL
  Filled 2021-11-07: qty 15

## 2021-11-07 MED ORDER — OXYCODONE HCL 5 MG/5ML PO SOLN: 5.0000 mg | Freq: Once | ORAL | Status: DC | PRN

## 2021-11-07 SURGICAL SUPPLY — 40 items
BAG COUNTER SPONGE SURGICOUNT (BAG) ×1 IMPLANT
BINDER BREAST XLRG (GAUZE/BANDAGES/DRESSINGS) ×2 IMPLANT
BIOPATCH RED 1 DISK 7.0 (GAUZE/BANDAGES/DRESSINGS) ×4 IMPLANT
BNDG ELASTIC 6X5.8 VLCR STR LF (GAUZE/BANDAGES/DRESSINGS) IMPLANT
CANISTER SUCT 3000ML PPV (MISCELLANEOUS) ×3 IMPLANT
CHLORAPREP W/TINT 26 (MISCELLANEOUS) ×1 IMPLANT
COVER SURGICAL LIGHT HANDLE (MISCELLANEOUS) ×3 IMPLANT
DRAIN CHANNEL 19F RND (DRAIN) IMPLANT
DRAIN JP 15F RND TROCAR (DRAIN) ×2 IMPLANT
DRAPE LAPAROSCOPIC ABDOMINAL (DRAPES) ×3 IMPLANT
DRSG MEPITEL 4X7.2 (GAUZE/BANDAGES/DRESSINGS) ×2 IMPLANT
DRSG PAD ABDOMINAL 8X10 ST (GAUZE/BANDAGES/DRESSINGS) ×2 IMPLANT
DRSG TEGADERM 4X4.75 (GAUZE/BANDAGES/DRESSINGS) ×2 IMPLANT
ELECT REM PT RETURN 9FT ADLT (ELECTROSURGICAL) ×3
ELECTRODE REM PT RTRN 9FT ADLT (ELECTROSURGICAL) ×2 IMPLANT
EVACUATOR SILICONE 100CC (DRAIN) ×2 IMPLANT
GAUZE SPONGE 2X2 8PLY STRL LF (GAUZE/BANDAGES/DRESSINGS) ×1 IMPLANT
GAUZE SPONGE 4X4 12PLY STRL (GAUZE/BANDAGES/DRESSINGS) IMPLANT
GEL CELLERATE 28G (Miscellaneous) ×2 IMPLANT
GLOVE BIO SURGEON STRL SZ 6.5 (GLOVE) ×6 IMPLANT
GOWN STRL REUS W/ TWL LRG LVL3 (GOWN DISPOSABLE) ×6 IMPLANT
GOWN STRL REUS W/TWL LRG LVL3 (GOWN DISPOSABLE) ×9
KIT BASIN OR (CUSTOM PROCEDURE TRAY) ×3 IMPLANT
KIT TURNOVER KIT B (KITS) ×3 IMPLANT
MARKER SKIN DUAL TIP RULER LAB (MISCELLANEOUS) ×3 IMPLANT
NDL 21 GA WING INFUSION (NEEDLE) IMPLANT
NEEDLE 21 GA WING INFUSION (NEEDLE) ×3 IMPLANT
NS IRRIG 1000ML POUR BTL (IV SOLUTION) ×1 IMPLANT
PACK GENERAL/GYN (CUSTOM PROCEDURE TRAY) ×3 IMPLANT
PAD ARMBOARD 7.5X6 YLW CONV (MISCELLANEOUS) ×6 IMPLANT
SPONGE GAUZE 2X2 STER 10/PKG (GAUZE/BANDAGES/DRESSINGS) ×3
STAPLER VISISTAT 35W (STAPLE) ×1 IMPLANT
SUT MNCRL AB 3-0 PS2 18 (SUTURE) ×6 IMPLANT
SUT MNCRL AB 4-0 PS2 18 (SUTURE) ×2 IMPLANT
SUT PROLENE 3 0 PS 2 (SUTURE) ×2 IMPLANT
SUT SILK 3 0 SH 30 (SUTURE) ×2 IMPLANT
SUT VIC AB 3-0 SH 18 (SUTURE) ×2 IMPLANT
TOWEL GREEN STERILE (TOWEL DISPOSABLE) ×3 IMPLANT
TOWEL GREEN STERILE FF (TOWEL DISPOSABLE) ×3 IMPLANT
WATER STERILE IRR 1000ML POUR (IV SOLUTION) IMPLANT

## 2021-11-07 NOTE — Interval H&P Note (Signed)
History and Physical Interval Note:  11/07/2021 3:29 PM  Shelly Hensley  has presented today for surgery, with the diagnosis of ABCESS RIGHT BREAST HISTORY OF CANCER.  The various methods of treatment have been discussed with the patient and family. After consideration of risks, benefits and other options for treatment, the patient has consented to  Procedure(s): REMOVAL OF TISSUE EXPANDER AND PLACEMENT OF IMPLANT WITH FLEX HD (Right) as a surgical intervention.  The patient's history has been reviewed, patient examined, no change in status, stable for surgery.  I have reviewed the patient's chart and labs.  Questions were answered to the patient's satisfaction.     Loel Lofty Jaysion Ramseyer

## 2021-11-07 NOTE — Interval H&P Note (Signed)
History and Physical Interval Note:  11/07/2021 3:29 PM  Shelly Hensley  has presented today for surgery, with the diagnosis of ABCESS RIGHT BREAST HISTORY OF CANCER.  The various methods of treatment have been discussed with the patient and family. After consideration of risks, benefits and other options for treatment, the patient has consented to  Procedure(s): REMOVAL OF TISSUE EXPANDER AND PLACEMENT OF IMPLANT WITH FLEX HD (Right) as a surgical intervention.  The patient's history has been reviewed, patient examined, no change in status, stable for surgery.  I have reviewed the patient's chart and labs.  Questions were answered to the patient's satisfaction.     Shelly Hensley

## 2021-11-07 NOTE — Interval H&P Note (Signed)
History and Physical Interval Note:  11/07/2021 3:30 PM  Shelly Hensley  has presented today for surgery, with the diagnosis of ABCESS RIGHT BREAST HISTORY OF CANCER.  The various methods of treatment have been discussed with the patient and family. After consideration of risks, benefits and other options for treatment, the patient has consented to  Procedure(s): REMOVAL OF TISSUE EXPANDER AND PLACEMENT OF IMPLANT WITH FLEX HD (Right) as a surgical intervention.  The patient's history has been reviewed, patient examined, no change in status, stable for surgery.  I have reviewed the patient's chart and labs.  Questions were answered to the patient's satisfaction.     Loel Lofty Kamran Coker

## 2021-11-07 NOTE — Op Note (Signed)
DATE OF OPERATION: 11/07/2021  LOCATION: [facility]  PREOPERATIVE DIAGNOSIS: Right breast seroma  POSTOPERATIVE DIAGNOSIS: Same  PROCEDURE: Removal of right breast expander  SURGEON: Lyndee Leo Sanger Bridgette Wolden, DO  ASSISTANT: Donnamarie Rossetti, PA  EBL: 5 cc  CONDITION: Stable  COMPLICATIONS: None  INDICATION: The patient, Shelly Hensley, is a 41 y.o. female born on [birth year], is here for treatment of fluid in the right breast pocket. Patient decided to wait to place another expander.  PROCEDURE DETAILS:  The patient was seen prior to surgery and marked.  The IV antibiotics were given. The patient was taken to the operating room and given a general anesthetic. A standard time out was performed and all information was confirmed by those in the room. SCDs were placed.   The breast was prepped and draped.  The #10 blade was used to make the incision in the previous incision site.  The expander was located and the fluid drained from the expander.  The expander was removed.  The ADM was separated from the lateral aspect under the incision but was not dissolved.  The pocket was irrigated with saline and antibiotic solution.  A drain was placed and secured to the chest wall with a 3-0 Silk.  The deep layer was closed with the 3-0 PDS followed by the 3-0 Monocryl.  The skin was closed with the 4-0 Monocryl.   Cellerate gel was applied with sterile dressings.  The patient was allowed to wake up and taken to recovery room in stable condition at the end of the case. The family was notified at the end of the case.   The advanced practice practitioner (APP) assisted throughout the case.  The APP was essential in retraction and counter traction when needed to make the case progress smoothly.  This retraction and assistance made it possible to see the tissue plans for the procedure.  The assistance was needed for blood control, tissue re-approximation and assisted with closure of the incision  site.

## 2021-11-07 NOTE — Interval H&P Note (Signed)
History and Physical Interval Note:  11/07/2021 3:30 PM  Shelly Hensley  has presented today for surgery, with the diagnosis of ABCESS RIGHT BREAST HISTORY OF CANCER.  The various methods of treatment have been discussed with the patient and family. After consideration of risks, benefits and other options for treatment, the patient has consented to  Procedure(s): REMOVAL OF TISSUE EXPANDER AND PLACEMENT OF IMPLANT WITH FLEX HD (Right) as a surgical intervention.  The patient's history has been reviewed, patient examined, no change in status, stable for surgery.  I have reviewed the patient's chart and labs.  Questions were answered to the patient's satisfaction.     Loel Lofty Zabria Liss

## 2021-11-07 NOTE — H&P (View-Only) (Signed)
  Subjective:  Mrs. Ismael is resting comfortably in her bed this morning.  She notes that she is feeling much better than she was yesterday.  She denies any fever overnight or this morning, she reports that her heart rate has gone down as well.  She is uncertain if the redness has changed.  She notes overnight that her previous drain site opened and was draining.  Objective: Vital signs in last 24 hours: Temp:  [97.7 F (36.5 C)-99.8 F (37.7 C)] 98.2 F (36.8 C) (08/16 0753) Pulse Rate:  [75-116] 86 (08/16 0753) Resp:  [17-18] 18 (08/16 0753) BP: (95-118)/(63-80) 112/80 (08/16 0753) SpO2:  [98 %-100 %] 100 % (08/16 0753) Last BM Date : 11/06/21   Physical Exam:  General: Pt resting comfortably in no acute distress Erythema noted along the right breast with purulence at the previous drain site    Assessment/Plan: s/p Procedure(s): REMOVAL OF TISSUE EXPANDER AND PLACEMENT OF IMPLANT WITH FLEX HD  Unfortunately Mrs. Rovner continues to have significant infection in her right breast.  I was able to express a significant mount of purulent material this morning.  Fortunately her fever has subsided and she is no longer tachycardic.  We do plan for surgical intervention this afternoon.  Please continue to remain NPO.     Stevie Kern Ennifer Harston, PA-C 11/07/2021

## 2021-11-07 NOTE — Anesthesia Postprocedure Evaluation (Signed)
Anesthesia Post Note  Patient: Shelly Hensley  Procedure(s) Performed: REMOVAL OF RIGHT TISSUE EXPANDER (Right: Breast)     Patient location during evaluation: PACU Anesthesia Type: General Level of consciousness: awake and alert Pain management: pain level controlled Vital Signs Assessment: post-procedure vital signs reviewed and stable Respiratory status: spontaneous breathing, nonlabored ventilation, respiratory function stable and patient connected to nasal cannula oxygen Cardiovascular status: blood pressure returned to baseline and stable Postop Assessment: no apparent nausea or vomiting Anesthetic complications: no   No notable events documented.  Last Vitals:  Vitals:   11/07/21 1700 11/07/21 1715  BP: (!) 93/59 98/62  Pulse: 77 78  Resp: 15 16  Temp:    SpO2: 94% 94%    Last Pain:  Vitals:   11/07/21 1523  TempSrc:   PainSc: 5                  Barnet Glasgow

## 2021-11-07 NOTE — Progress Notes (Signed)
PROGRESS NOTE   Shelly Hensley  GLO:[id number] DOB: 10-14-1980 DOA: 11/05/2021 PCP: Marco Collie, MD  Brief Narrative:   41 year old female known history of thyroid nodule Right breast cancer status post bilateral mastectomy + spacer placement 09/2021 Dr. Melford Aase been seen 10/30/2021 and had 50 cc of saline injected into bilateral expanders  Followed in plastic clinic-felt that she had an infection along the right lower breast she was started on Bactrim developed syncope 11/05/2021 with underlying erythema lower aspect right breast (initially thought secondary to bra rubbing the area) Tmax 100 degrees ED work-up = BUN/creatinine 8/0.6 WBC 13.6 hemoglobin 11.7 Blood culture obtained 8/14 no growth to date CT chest = thick-walled peripherally enhancing fluid collection around right breast implant concerning for abscess 3.1 X2.7X 9.2 cm  Dr. Marla Roe plastic surgery consulted 80 cc cloudy yellow fluid aspirated Started on IV Unasyn Plan for possible right breast tissue expander exchange per Plastics  Hospital-Problem based course  Abscess of breast implant Gram-positive cocci present Aspirate cultures pending Continues on Unasyn every 6 Plan for definitive surgical management 8/16 per plastic surgery Continue saline 75 cc/H Headache Can use Fioricet every 6 as needed headache, ibuprofen every 6 as needed as well   DVT prophylaxis: Lovenox Code Status: Full Family Communication: Husband at bedside discussed with him Disposition:  Status is: Inpatient Remains inpatient appropriate because:  Requires surgery    Consultants:  Plastic surgery  Procedures:  Antimicrobials: Unasyn   Subjective: Patient awake alert coherent no distress She has a mild headache today Area of right breast was examined earlier with purulent discharge per nursing staff-it has no drainage today on my exam  Objective: Vitals:   11/06/21 1800 11/06/21 2222 11/07/21 0141 11/07/21 0530  BP:  95/63 101/69 95/74 109/76  Pulse: 90 88 90 75  Resp: '17 17  18  '$ Temp: 97.7 F (36.5 C) 98.8 F (37.1 C) 98.2 F (36.8 C) 98 F (36.7 C)  TempSrc: Oral Oral Oral Oral  SpO2: 100% 99% 99% 99%  Weight:      Height:        Intake/Output Summary (Last 24 hours) at 11/07/2021 0635 Last data filed at 11/07/2021 0516 Gross per 24 hour  Intake 1453.2 ml  Output --  Net 1453.2 ml   Filed Weights   11/05/21 2047  Weight: 61.2 kg    Examination:  Pleasant coherent no distress EOMI NCAT Chest is clear Small area of erythema underneath right breast-otherwise seems stable Abdomen is soft No lower extremity edema  Data Reviewed: personally reviewed   CBC    Component Value Date/Time   WBC 5.6 11/07/2021 0157   RBC 3.02 (L) 11/07/2021 0157   HGB 9.5 (L) 11/07/2021 0157   HCT 28.9 (L) 11/07/2021 0157   PLT 121 (L) 11/07/2021 0157   MCV 95.7 11/07/2021 0157   MCH 31.5 11/07/2021 0157   MCHC 32.9 11/07/2021 0157   RDW 12.8 11/07/2021 0157   LYMPHSABS 0.6 (L) 11/05/2021 2053   MONOABS 1.2 (H) 11/05/2021 2053   EOSABS 0.0 11/05/2021 2053   BASOSABS 0.0 11/05/2021 2053      Latest Ref Rng & Units 11/07/2021    1:57 AM 11/05/2021    8:53 PM 10/26/2021   12:00 AM  CMP  Glucose 70 - 99 mg/dL 117  145    BUN 6 - 20 mg/dL '7  8  12      '$ Creatinine 0.44 - 1.00 mg/dL 0.68  0.69  0.7      Sodium  135 - 145 mmol/L 137  136  136      Potassium 3.5 - 5.1 mmol/L 3.4  3.7  3.8      Chloride 98 - 111 mmol/L 107  103  105      CO2 22 - 32 mmol/L '24  24  23      '$ Calcium 8.9 - 10.3 mg/dL 8.2  9.2  9.0      Alkaline Phos 25 - 125   46      AST 13 - 35   26      ALT 7 - 35 U/L   22         This result is from an external source.     Radiology Studies: CT Chest W Contrast  Result Date: 11/06/2021 CLINICAL DATA:  Syncopal episode today, fevers, redness under right breast; mastectomy in July EXAM: CT CHEST WITH CONTRAST TECHNIQUE: Multidetector CT imaging of the chest was performed during  intravenous contrast administration. RADIATION DOSE REDUCTION: This exam was performed according to the departmental dose-optimization program which includes automated exposure control, adjustment of the mA and/or kV according to patient size and/or use of iterative reconstruction technique. CONTRAST:  3m OMNIPAQUE IOHEXOL 300 MG/ML  SOLN COMPARISON:  None Available. FINDINGS: Cardiovascular: No significant vascular findings. Normal heart size. No pericardial effusion. Mediastinum/Nodes: No enlarged mediastinal, hilar, or axillary lymph nodes. Thyroid gland, trachea, and esophagus demonstrate no significant findings. Lungs/Pleura: Bibasilar scarring/atelectasis. No pleural effusion or pneumothorax. Upper Abdomen: No acute abnormality. Musculoskeletal: Bilateral saline breast implants. There is a fluid collection around the right implant extending inferiorly and medially to the implant. The fluid collection demonstrates thick enhancing walls and measures approximately 3.1 x 2.7 x 9.2 cm mild adjacent edema and fat stranding. No acute osseous abnormality. IMPRESSION: Thick-walled peripherally enhancing fluid collection about the right breast implant concerning for abscess. Electronically Signed   By: TPlacido SouM.D.   On: 11/06/2021 01:24     Scheduled Meds:  enoxaparin (LOVENOX) injection  40 mg Subcutaneous Q24H   sodium chloride flush  3 mL Intravenous Q12H   Continuous Infusions:  sodium chloride 75 mL/hr at 11/07/21 0516   ampicillin-sulbactam (UNASYN) IV 3 g (11/07/21 0530)     LOS: 1 day   Time spent: 3[place] MD [facility] To contact the attending provider between 7A-7P or the covering provider during after hours 7P-7A, please log into the web site [email or web address] and access using universal [place] password for that web site. If you do not have the password, please call the hospital operator.  11/07/2021, 6:35 AM

## 2021-11-07 NOTE — Progress Notes (Signed)
  Subjective:  Shelly Hensley is resting comfortably in her bed this morning.  She notes that she is feeling much better than she was yesterday.  She denies any fever overnight or this morning, she reports that her heart rate has gone down as well.  She is uncertain if the redness has changed.  She notes overnight that her previous drain site opened and was draining.  Objective: Vital signs in last 24 hours: Temp:  [97.7 F (36.5 C)-99.8 F (37.7 C)] 98.2 F (36.8 C) (08/16 0753) Pulse Rate:  [75-116] 86 (08/16 0753) Resp:  [17-18] 18 (08/16 0753) BP: (95-118)/(63-80) 112/80 (08/16 0753) SpO2:  [98 %-100 %] 100 % (08/16 0753) Last BM Date : 11/06/21   Physical Exam:  General: Pt resting comfortably in no acute distress Erythema noted along the right breast with purulence at the previous drain site    Assessment/Plan: s/p Procedure(s): REMOVAL OF TISSUE EXPANDER AND PLACEMENT OF IMPLANT WITH FLEX HD  Unfortunately Mrs. Platts continues to have significant infection in her right breast.  I was able to express a significant mount of purulent material this morning.  Fortunately her fever has subsided and she is no longer tachycardic.  We do plan for surgical intervention this afternoon.  Please continue to remain NPO.     Stevie Kern Jamaria Amborn, PA-C 11/07/2021

## 2021-11-07 NOTE — Transfer of Care (Signed)
Immediate Anesthesia Transfer of Care Note  Patient: Shelly Hensley  Procedure(s) Performed: REMOVAL OF RIGHT TISSUE EXPANDER (Right: Breast)  Patient Location: PACU  Anesthesia Type:General  Level of Consciousness: drowsy and patient cooperative  Airway & Oxygen Therapy: Patient Spontanous Breathing  Post-op Assessment: Report given to RN, Post -op Vital signs reviewed and stable and Patient moving all extremities X 4  Post vital signs: Reviewed and stable  Last Vitals:  Vitals Value Taken Time  BP 93/62 11/07/21 1650  Temp    Pulse 85 11/07/21 1652  Resp 14 11/07/21 1652  SpO2 90 % 11/07/21 1652  Vitals shown include unvalidated device data.  Last Pain:  Vitals:   11/07/21 1523  TempSrc:   PainSc: 5       Patients Stated Pain Goal: 0 (41/59/30 1237)  Complications: No notable events documented.

## 2021-11-07 NOTE — Interval H&P Note (Signed)
History and Physical Interval Note:  11/07/2021 3:29 PM  Shelly Hensley  has presented today for surgery, with the diagnosis of ABCESS RIGHT BREAST HISTORY OF CANCER.  The various methods of treatment have been discussed with the patient and family. After consideration of risks, benefits and other options for treatment, the patient has consented to  Procedure(s): REMOVAL OF TISSUE EXPANDER AND PLACEMENT OF IMPLANT WITH FLEX HD (Right) as a surgical intervention.  The patient's history has been reviewed, patient examined, no change in status, stable for surgery.  I have reviewed the patient's chart and labs.  Questions were answered to the patient's satisfaction.     Shelly Hensley Shelly Hensley

## 2021-11-07 NOTE — Anesthesia Procedure Notes (Signed)
Procedure Name: LMA Insertion Date/Time: 11/07/2021 4:21 PM  Performed by: Darletta Moll, CRNAPre-anesthesia Checklist: Patient identified, Emergency Drugs available, Suction available and Patient being monitored Patient Re-evaluated:Patient Re-evaluated prior to induction Oxygen Delivery Method: Circle system utilized Preoxygenation: Pre-oxygenation with 100% oxygen Induction Type: IV induction Ventilation: Mask ventilation without difficulty LMA: LMA inserted LMA Size: 4.0 Number of attempts: 1 Placement Confirmation: positive ETCO2, breath sounds checked- equal and bilateral and CO2 detector Tube secured with: Tape Dental Injury: Teeth and Oropharynx as per pre-operative assessment

## 2021-11-07 NOTE — Anesthesia Preprocedure Evaluation (Addendum)
Anesthesia Evaluation  Patient identified by MRN, date of birth, ID band Patient awake    Reviewed: Allergy & Precautions, NPO status , Patient's Chart, lab work & pertinent test results  Airway Mallampati: II  TM Distance: >3 FB Neck ROM: Full    Dental no notable dental hx.    Pulmonary neg pulmonary ROS,    Pulmonary exam normal breath sounds clear to auscultation       Cardiovascular negative cardio ROS Normal cardiovascular exam Rhythm:Regular Rate:Normal     Neuro/Psych negative neurological ROS  negative psych ROS   GI/Hepatic negative GI ROS, Neg liver ROS,   Endo/Other  negative endocrine ROS  Renal/GU negative Renal ROSMWF dialysis  negative genitourinary   Musculoskeletal negative musculoskeletal ROS (+)   Abdominal   Peds negative pediatric ROS (+)  Hematology  (+) Blood dyscrasia, anemia , Lab Results      Component                Value               Date                      WBC                      5.6                 11/07/2021                HGB                      9.5 (L)             11/07/2021                HCT                      28.9 (L)            11/07/2021                MCV                      95.7                11/07/2021                PLT                      121 (L)             11/07/2021              Anesthesia Other Findings   Reproductive/Obstetrics negative OB ROS                           Anesthesia Physical Anesthesia Plan  ASA: 2  Anesthesia Plan: General   Post-op Pain Management: Minimal or no pain anticipated   Induction: Intravenous  PONV Risk Score and Plan: 3 and Ondansetron, Dexamethasone, Midazolam, Treatment may vary due to age or medical condition, TIVA and Propofol infusion  Airway Management Planned: LMA  Additional Equipment:   Intra-op Plan:   Post-operative Plan: Extubation in OR  Informed Consent: I have reviewed  the patients History and Physical, chart, labs and discussed the procedure including the risks, benefits and alternatives for the proposed anesthesia  with the patient or authorized representative who has indicated his/her understanding and acceptance.     Dental advisory given  Plan Discussed with: CRNA and Surgeon  Anesthesia Plan Comments: (TIVA GA w LMA)       Anesthesia Quick Evaluation

## 2021-11-08 ENCOUNTER — Encounter (HOSPITAL_COMMUNITY): Payer: Self-pay | Admitting: Plastic Surgery

## 2021-11-08 ENCOUNTER — Ambulatory Visit: Payer: Commercial Managed Care - PPO | Admitting: Physician Assistant

## 2021-11-08 DIAGNOSIS — N611 Abscess of the breast and nipple: Secondary | ICD-10-CM | POA: Diagnosis not present

## 2021-11-08 LAB — RENAL FUNCTION PANEL
Albumin: 2.6 g/dL — ABNORMAL LOW (ref 3.5–5.0)
Anion gap: 8 (ref 5–15)
BUN: 8 mg/dL (ref 6–20)
CO2: 24 mmol/L (ref 22–32)
Calcium: 8.5 mg/dL — ABNORMAL LOW (ref 8.9–10.3)
Chloride: 105 mmol/L (ref 98–111)
Creatinine, Ser: 0.74 mg/dL (ref 0.44–1.00)
GFR, Estimated: >60 mL/min (ref >60)
Glucose, Bld: 188 mg/dL — ABNORMAL HIGH (ref 70–99)
Phosphorus: 2.5 mg/dL (ref 2.5–4.6)
Potassium: 3.8 mmol/L (ref 3.5–5.1)
Sodium: 137 mmol/L (ref 135–145)

## 2021-11-08 LAB — CBC WITH DIFFERENTIAL/PLATELET
Abs Immature Granulocytes: 0.02 10*3/uL (ref 0.00–0.07)
Basophils Absolute: 0 10*3/uL (ref 0.0–0.1)
Basophils Relative: 0 %
Eosinophils Absolute: 0 10*3/uL (ref 0.0–0.5)
Eosinophils Relative: 0 %
HCT: 29.7 % — ABNORMAL LOW (ref 36.0–46.0)
Hemoglobin: 10.1 g/dL — ABNORMAL LOW (ref 12.0–15.0)
Immature Granulocytes: 0 %
Lymphocytes Relative: 7 %
Lymphs Abs: 0.4 10*3/uL — ABNORMAL LOW (ref 0.7–4.0)
MCH: 32.2 pg (ref 26.0–34.0)
MCHC: 34 g/dL (ref 30.0–36.0)
MCV: 94.6 fL (ref 80.0–100.0)
Monocytes Absolute: 0.2 10*3/uL (ref 0.1–1.0)
Monocytes Relative: 4 %
Neutro Abs: 4.6 10*3/uL (ref 1.7–7.7)
Neutrophils Relative %: 89 %
Platelets: 147 10*3/uL — ABNORMAL LOW (ref 150–400)
RBC: 3.14 MIL/uL — ABNORMAL LOW (ref 3.87–5.11)
RDW: 13 % (ref 11.5–15.5)
WBC: 5.2 10*3/uL (ref 4.0–10.5)
nRBC: 0 % (ref 0.0–0.2)

## 2021-11-08 MED ORDER — FLUCONAZOLE 150 MG PO TABS
150.0000 mg | ORAL_TABLET | Freq: Every day | ORAL | Status: DC
  Administered 2021-11-08 – 2021-11-09 (×2): 150 mg via ORAL
  Filled 2021-11-08 (×2): qty 1

## 2021-11-08 MED ORDER — PANTOPRAZOLE SODIUM 20 MG PO TBEC
20.0000 mg | DELAYED_RELEASE_TABLET | Freq: Every day | ORAL | Status: DC
Start: 2021-11-08 — End: 2021-11-08

## 2021-11-08 MED ORDER — PANTOPRAZOLE SODIUM 40 MG PO TBEC
40.0000 mg | DELAYED_RELEASE_TABLET | Freq: Two times a day (BID) | ORAL | Status: DC
  Administered 2021-11-08 – 2021-11-09 (×3): 40 mg via ORAL
  Filled 2021-11-08 (×3): qty 1

## 2021-11-08 NOTE — Progress Notes (Signed)
PROGRESS NOTE   Shelly Hensley  JTT:[id number] DOB: 1981-02-03 DOA: 11/05/2021 PCP: Marco Collie, MD  Brief Narrative:   41 year old female known history of thyroid nodule Right breast cancer status post bilateral mastectomy + spacer placement 09/2021 Dr. Melford Aase been seen 10/30/2021 and had 50 cc of saline injected into bilateral expanders  Followed in plastic clinic-felt that she had an infection along the right lower breast she was started on Bactrim developed syncope 11/05/2021 with underlying erythema lower aspect right breast (initially thought secondary to bra rubbing the area) Tmax 100 degrees ED work-up = BUN/creatinine 8/0.6 WBC 13.6 hemoglobin 11.7 Blood culture obtained 8/14 no growth to date CT chest = thick-walled peripherally enhancing fluid collection around right breast implant concerning for abscess 3.1 X2.7X 9.2 cm  Dr. Marla Roe plastic surgery consulted 80 cc cloudy yellow fluid aspirated Started on IV Unasyn Underwent right breast tissue expander removal  Hospital-Problem based course  Abscess of breast implant status post removal of right breast expander Staphylococcus growing in abscess culture discussed with mother at the bedside Aspirate cultures showing predominant Staphylococcus Continues on Unasyn every 6--if MSSA would transition to orals in a.m. Continue saline 75 cc/H Headache Can use Fioricet every 6 as needed headache, ibuprofen every 6 as needed as well   DVT prophylaxis: Lovenox Code Status: Full Family Communication: Husband at bedside discussed with him Disposition:  Status is: Inpatient Remains inpatient appropriate because:  Requires surgery    Consultants:  Plastic surgery  Procedures:  Antimicrobials: Unasyn   Subjective:  Coherent no distres pain is moderate and relatively well controlled No fevers or chills  Objective: Vitals:   11/07/21 1950 11/08/21 0005 11/08/21 0439 11/08/21 0749  BP: 106/73 131/86 127/85  132/85  Pulse: 92 60 75 (!) 55  Resp: '18 18 18 17  '$ Temp: 98.1 F (36.7 C) 98 F (36.7 C) 99.6 F (37.6 C) 97.9 F (36.6 C)  TempSrc: Oral Oral Oral Oral  SpO2: 95% 97% 96% 98%  Weight:      Height:        Intake/Output Summary (Last 24 hours) at 11/08/2021 1109 Last data filed at 11/08/2021 0847 Gross per 24 hour  Intake 2887.72 ml  Output 35 ml  Net 2852.72 ml    Filed Weights   11/05/21 2047 11/07/21 1450  Weight: 61.2 kg 61.2 kg    Examination:  Pleasant no distress chest is clear I deferred breast exam today No lower extremity edema ROM intact  Data Reviewed: personally reviewed   CBC    Component Value Date/Time   WBC 5.2 11/08/2021 0056   RBC 3.14 (L) 11/08/2021 0056   HGB 10.1 (L) 11/08/2021 0056   HCT 29.7 (L) 11/08/2021 0056   PLT 147 (L) 11/08/2021 0056   MCV 94.6 11/08/2021 0056   MCH 32.2 11/08/2021 0056   MCHC 34.0 11/08/2021 0056   RDW 13.0 11/08/2021 0056   LYMPHSABS 0.4 (L) 11/08/2021 0056   MONOABS 0.2 11/08/2021 0056   EOSABS 0.0 11/08/2021 0056   BASOSABS 0.0 11/08/2021 0056      Latest Ref Rng & Units 11/08/2021   12:56 AM 11/07/2021    1:57 AM 11/05/2021    8:53 PM  CMP  Glucose 70 - 99 mg/dL 188  117  145   BUN 6 - 20 mg/dL '8  7  8   '$ Creatinine 0.44 - 1.00 mg/dL 0.74  0.68  0.69   Sodium 135 - 145 mmol/L 137  137  136   Potassium 3.5 -  5.1 mmol/L 3.8  3.4  3.7   Chloride 98 - 111 mmol/L 105  107  103   CO2 22 - 32 mmol/L '24  24  24   '$ Calcium 8.9 - 10.3 mg/dL 8.5  8.2  9.2      Radiology Studies: No results found.   Scheduled Meds:  acidophilus  1 capsule Oral Daily   clotrimazole  1 Applicatorful Vaginal QHS   enoxaparin (LOVENOX) injection  40 mg Subcutaneous Q24H   pantoprazole  20 mg Oral Daily   sodium chloride flush  3 mL Intravenous Q12H   Continuous Infusions:  sodium chloride 75 mL/hr at 11/08/21 0522   ampicillin-sulbactam (UNASYN) IV 3 g (11/08/21 0601)     LOS: 2 days   Time spent: [place], MD [facility] To contact the attending provider between 7A-7P or the covering provider during after hours 7P-7A, please log into the web site [email or web address] and access using universal [place] password for that web site. If you do not have the password, please call the hospital operator.  11/08/2021, 11:09 AM

## 2021-11-08 NOTE — Progress Notes (Signed)
1 Day Post-Op  Subjective: Patient is a 41 year old female with history of right breast cancer.  Patient underwent bilateral mastectomy with immediate breast reconstruction with placement of Flex HD and tissue expanders on 10/02/2021.  Patient most recently presented to the [facility] emergency room on 11/05/2021 with generalized fatigue with a syncopal episode and right breast swelling and redness.  Patient was admitted to the hospital for observation and IV antibiotics.  Patient was taken to the operating room yesterday with Dr. Marla Roe for removal of right breast expander.  Today patient is postop day 1.  Today, patient reports she is feeling much better.  Patient reports her pain to the right breast has much improved as well.  She denies any acute events overnight.  She denies any fevers or chills.  Patient reports her pain is well controlled.  She states she has been eating and drinking without issue.  Patient reports that she sometimes has some reflux.  She denies any chest pain or shortness of breath.  Patient reports that she has been voiding without issue, passing gas and ambulating without issue.  Objective: Vital signs in last 24 hours: Temp:  [97.9 F (36.6 C)-99.6 F (37.6 C)] 99.6 F (37.6 C) (08/17 0439) Pulse Rate:  [60-94] 75 (08/17 0439) Resp:  [14-20] 18 (08/17 0439) BP: (93-131)/(59-86) 127/85 (08/17 0439) SpO2:  [94 %-100 %] 96 % (08/17 0439) Weight:  [61.2 kg] 61.2 kg (08/16 1450) Last BM Date : 11/06/21  Intake/Output from previous day: 08/16 0701 - 08/17 0700 In: 2667.7 [P.O.:240; I.V.:1927.7; IV Piggyback:500] Out: 35 [Drains:30; Blood:5] Intake/Output this shift: No intake/output data recorded.  General appearance: alert, cooperative, and no distress Resp: Unlabored breathing, no respiratory distress Chest wall: Dressings to right breast incision are clean dry and intact.  There is still some surrounding erythema and induration to the inferior and lateral  aspects of the right breast.  There are no significant fluid collections palpated on exam.  The JP drain is in place and functioning.  There is approximately 20 cc of serosanguineous drainage in the bulb.  Lab Results:     Latest Ref Rng & Units 11/08/2021   12:56 AM 11/07/2021    1:57 AM 11/05/2021    8:53 PM  CBC  WBC 4.0 - 10.5 K/uL 5.2  5.6  13.6   Hemoglobin 12.0 - 15.0 g/dL 10.1  9.5  11.7   Hematocrit 36.0 - 46.0 % 29.7  28.9  34.4   Platelets 150 - 400 K/uL 147  121  186     BMET Recent Labs    11/07/21 0157 11/08/21 0056  NA 137 137  K 3.4* 3.8  CL 107 105  CO2 24 24  GLUCOSE 117* 188*  BUN 7 8  CREATININE 0.68 0.74  CALCIUM 8.2* 8.5*   PT/INR No results for input(s): "LABPROT", "INR" in the last 72 hours. ABG No results for input(s): "PHART", "HCO3" in the last 72 hours.  Invalid input(s): "PCO2", "PO2"  Studies/Results: No results found.  Anti-infectives: Anti-infectives (From admission, onward)    Start     Dose/Rate Route Frequency Ordered Stop   11/07/21 1545  ceFAZolin 1 g / gentamicin 80 mg in NS 500 mL surgical irrigation         Irrigation  Once 11/07/21 1541 11/07/21 1619   11/07/21 1456  ceFAZolin (ANCEF) 2-4 GM/100ML-% IVPB       Note to Pharmacy: Gleason, Ginger E: cabinet override      11/07/21 1456 11/08/21  [id number]   11/07/21 0830  ceFAZolin (ANCEF) IVPB 2g/100 mL premix  Status:  Discontinued        2 g 200 mL/hr over 30 Minutes Intravenous On call to O.R. 11/07/21 0742 11/07/21 1110   11/06/21 1300  Ampicillin-Sulbactam (UNASYN) 3 g in sodium chloride 0.9 % 100 mL IVPB        3 g 200 mL/hr over 30 Minutes Intravenous Every 6 hours 11/06/21 1157     11/06/21 0245  Ampicillin-Sulbactam (UNASYN) 3 g in sodium chloride 0.9 % 100 mL IVPB        3 g 200 mL/hr over 30 Minutes Intravenous  Once 11/06/21 0242 11/06/21 0343   11/06/21 0230  sulfamethoxazole-trimethoprim (BACTRIM) 160 mg of trimethoprim in dextrose 5 % 250 mL IVPB  Status:   Discontinued        160 mg of trimethoprim 260 mL/hr over 60 Minutes Intravenous  Once 11/06/21 0217 11/06/21 0350       Assessment/Plan: s/p Procedure(s): REMOVAL OF RIGHT TISSUE EXPANDER  Plan: -Continue IV antibiotics -Continue to monitor drain output -Protonix ordered for reflux  -Medical management and pain control per hospitalist team  Objective findings and plan discussed with Dr. Marla Roe    LOS: 2 days    Clance Boll, PA-C 11/08/2021

## 2021-11-09 LAB — CBC WITH DIFFERENTIAL/PLATELET
Abs Immature Granulocytes: 0.05 10*3/uL (ref 0.00–0.07)
Basophils Absolute: 0 10*3/uL (ref 0.0–0.1)
Basophils Relative: 0 %
Eosinophils Absolute: 0.1 10*3/uL (ref 0.0–0.5)
Eosinophils Relative: 1 %
HCT: 26.5 % — ABNORMAL LOW (ref 36.0–46.0)
Hemoglobin: 9 g/dL — ABNORMAL LOW (ref 12.0–15.0)
Immature Granulocytes: 1 %
Lymphocytes Relative: 32 %
Lymphs Abs: 1.7 10*3/uL (ref 0.7–4.0)
MCH: 32.3 pg (ref 26.0–34.0)
MCHC: 34 g/dL (ref 30.0–36.0)
MCV: 95 fL (ref 80.0–100.0)
Monocytes Absolute: 0.4 10*3/uL (ref 0.1–1.0)
Monocytes Relative: 8 %
Neutro Abs: 3.1 10*3/uL (ref 1.7–7.7)
Neutrophils Relative %: 58 %
Platelets: 170 10*3/uL (ref 150–400)
RBC: 2.79 MIL/uL — ABNORMAL LOW (ref 3.87–5.11)
RDW: 13.1 % (ref 11.5–15.5)
WBC: 5.3 10*3/uL (ref 4.0–10.5)
nRBC: 0 % (ref 0.0–0.2)

## 2021-11-09 LAB — COMPREHENSIVE METABOLIC PANEL
ALT: 36 U/L (ref 0–44)
AST: 28 U/L (ref 15–41)
Albumin: 2.3 g/dL — ABNORMAL LOW (ref 3.5–5.0)
Alkaline Phosphatase: 59 U/L (ref 38–126)
Anion gap: 11 (ref 5–15)
BUN: 13 mg/dL (ref 6–20)
CO2: 21 mmol/L — ABNORMAL LOW (ref 22–32)
Calcium: 8.4 mg/dL — ABNORMAL LOW (ref 8.9–10.3)
Chloride: 107 mmol/L (ref 98–111)
Creatinine, Ser: 0.68 mg/dL (ref 0.44–1.00)
GFR, Estimated: >60 mL/min (ref >60)
Glucose, Bld: 107 mg/dL — ABNORMAL HIGH (ref 70–99)
Potassium: 3.5 mmol/L (ref 3.5–5.1)
Sodium: 139 mmol/L (ref 135–145)
Total Bilirubin: 0.4 mg/dL (ref 0.3–1.2)
Total Protein: 5.3 g/dL — ABNORMAL LOW (ref 6.5–8.1)

## 2021-11-09 MED ORDER — CEPHALEXIN 500 MG PO CAPS: 500.0000 mg | ORAL_CAPSULE | Freq: Four times a day (QID) | ORAL | 0 refills | Status: AC

## 2021-11-09 MED ORDER — FLUCONAZOLE 150 MG PO TABS: 150.0000 mg | ORAL_TABLET | Freq: Every day | ORAL | 0 refills | Status: DC

## 2021-11-09 MED ORDER — CEPHALEXIN 500 MG PO CAPS
500.0000 mg | ORAL_CAPSULE | Freq: Four times a day (QID) | ORAL | Status: DC
  Administered 2021-11-09: 500 mg via ORAL
  Filled 2021-11-09: qty 1

## 2021-11-09 NOTE — Progress Notes (Signed)
Looks good feels well has some sero-sang drainage in drain No chest pain fever chills Swelling is mainly in thighs and hips and likely from fluid  She is ok from my perspective for d/c  Would use Keflex 500 qid to complete 14 days on 8/27   Signing off--thanks for involving [place] in her care   No charge  Verneita Griffes, MD [facility] 9:51 AM

## 2021-11-09 NOTE — Discharge Summary (Signed)
Physician Discharge Summary  Patient ID: Shelly Hensley MRN: [id number] DOB/AGE: 1981-02-05 41 y.o.  Admit date: 11/05/2021 Discharge date: 11/09/2021  Admission Diagnoses: right breast seroma / infection  Discharge Diagnoses:  Principal Problem:   Abscess of right breast Active Problems:   Ductal carcinoma in situ (DCIS) of left breast   Sepsis ([place])   Syncope, vasovagal   Leukocytosis   Abnormal urinalysis   Normocytic anemia   Discharged Condition: good  Hospital Course: The patient was admitted through the emergency room for the right breast issue.  She underwent bilateral mastectomies July 11.  She was doing well until 8/14 when she presented to the ED with redness and swelling of the right breast.  She was admitted and started on IV antibiotics.  The decision was made to remove the expander.  She has done well and improving.    Consults:  plastic surgery  Significant Diagnostic Studies: cultures  Treatments: surgery: Patient was taken back to the operating room and underwent removal of the right breast expander with washout.  A drain was placed.  Discharge Exam: Blood pressure 135/83, pulse (!) 55, temperature 97.9 F (36.6 C), temperature source Oral, resp. rate 19, height '5\' 2"'$  (1.575 m), weight 61.2 kg, last menstrual period 10/31/2021, SpO2 98 %. General appearance: alert, cooperative, and redness improved and swelling improved. Breasts: normal appearance, no masses or tenderness on the left  Disposition: Discharge disposition: 01-Home or Self Care       Discharge Instructions     Diet - low sodium heart healthy   Complete by: As directed    Discharge instructions   Complete by: As directed    Complete the Keflex dosing as discussed You may take another dose of Fluconazole for candidiasis/thrush We have called in these meds to your pharmacy in [place] follow as an outpatient with Dr. Marla Roe for further care instructions   Discharge wound care:    Complete by: As directed    Per DR. Isyss Espinal   Increase activity slowly   Complete by: As directed       Allergies as of 11/09/2021       Reactions   Other Anaphylaxis, Other (See Comments)   Tree nuts        Medication List     STOP taking these medications    sulfamethoxazole-trimethoprim 800-160 MG tablet Commonly known as: BACTRIM DS       TAKE these medications    acetaminophen 500 MG tablet Commonly known as: TYLENOL Take 500-1,000 mg by mouth 2 (two) times daily as needed for mild pain.   ALOE VERA PO Take 1 capsule by mouth daily.   cephALEXin 500 MG capsule Commonly known as: KEFLEX Take 1 capsule (500 mg total) by mouth every 6 (six) hours for 9 days.   diazepam 2 MG tablet Commonly known as: Valium Take 1 tablet (2 mg total) by mouth every 12 (twelve) hours as needed for muscle spasms.   fluconazole 150 MG tablet Commonly known as: DIFLUCAN Take 1 tablet (150 mg total) by mouth daily. Start taking on: November 10, 2021   ibuprofen 200 MG tablet Commonly known as: ADVIL Take 600 mg by mouth 2 (two) times daily as needed for headache or mild pain.   loratadine 10 MG tablet Commonly known as: CLARITIN Take 10 mg by mouth daily.   ondansetron 4 MG tablet Commonly known as: Zofran Take 1 tablet (4 mg total) by mouth every 8 (eight) hours as needed for  nausea or vomiting.   OVER THE COUNTER MEDICATION Take 1 capsule by mouth daily. Juice Plus   VITAMIN D-3 PO Take 1 capsule by mouth daily.               Discharge Care Instructions  (From admission, onward)           Start     Ordered   11/09/21 0000  Discharge wound care:       Comments: Per DR. Khalon Cansler   11/09/21 1144            Follow-up Information     Cruzito Standre, Loel Lofty, DO Follow up in 10 day(s).   Specialty: Plastic Surgery Contact information: [place] [address] [phone]                 Signed: Loel Lofty  Shelton Soler 11/09/2021, 12:02 PM

## 2021-11-09 NOTE — Progress Notes (Signed)
[name] to be D/C'd  per MD order.  Discussed with the patient and all questions fully answered.  VSS, Skin clean, dry and intact without evidence of skin break down, no evidence of skin tears noted.  IV catheter discontinued intact. Site without signs and symptoms of complications. Dressing and pressure applied.  An After Visit Summary was printed and given to the patient.   D/c education completed with patient/family including follow up instructions, medication list, d/c activities limitations if indicated, with other d/c instructions as indicated by MD - patient able to verbalize understanding, all questions fully answered.   Patient instructed to return to ED, call 911, or call MD for any changes in condition.   Patient to be escorted via [place], and D/C home via private auto.

## 2021-11-11 LAB — CULTURE, BLOOD (ROUTINE X 2)
Culture: NO GROWTH
Culture: NO GROWTH
Special Requests: ADEQUATE

## 2021-11-12 LAB — AEROBIC/ANAEROBIC CULTURE W GRAM STAIN (SURGICAL/DEEP WOUND)

## 2021-11-13 ENCOUNTER — Ambulatory Visit (INDEPENDENT_AMBULATORY_CARE_PROVIDER_SITE_OTHER): Payer: Commercial Managed Care - PPO | Admitting: Student

## 2021-11-13 ENCOUNTER — Encounter: Payer: Self-pay | Admitting: Student

## 2021-11-13 DIAGNOSIS — D0512 Intraductal carcinoma in situ of left breast: Secondary | ICD-10-CM

## 2021-11-13 NOTE — Progress Notes (Signed)
Patient is a 41 year old female with history of right breast cancer.  Patient underwent bilateral mastectomy with immediate breast reconstruction with placement of Flex HD and tissue expanders on 10/02/2021.  Patient most recently presented to the [facility] emergency room on 11/05/2021 with generalized fatigue with a syncopal episode and right breast swelling and redness.  Patient was admitted to the hospital for observation and IV antibiotics.    During this hospitalization, patient underwent removal of her right breast expander in the operating room with Dr. Marla Roe on 11/07/2021.  Patient was discharged from the hospital on 11/09/2021.  Patient presents to the clinic today for postoperative follow-up.  Today, patient reports she is doing well.  She states she has been feeling much better since she left the hospital.  She states that she has been sleeping well.  She denies any issues or concerns since leaving the hospital.  Patient denies any fevers or chills.  Patient reports that drain output has been fairly minimal, and that which she currently has in the bulb has been there since yesterday.  Patient reports she has been taking her antibiotics that she has been prescribed from the hospital.  She states that it is a 9-day course, so she has until Sunday until she is out of antibiotics.  Chaperone present on exam.  On exam, patient is sitting upright in no acute distress.  Left breast expanders in place.  There is no overlying erythema or ecchymosis.  Incision is intact and healing well.  Right breast incision is intact with Steri-Strips.  There is some mild ecchymosis noted to the right breast and some mild induration to the inferior aspect of the right breast.  The right drain is in place and functioning.  There is approximately 20 cc of serosanguineous drainage in the bulb.  I discussed with the patient to continue to take her antibiotics and to monitor the area.  I discussed with the patient that we  will hold off on an expander fill to the left side today and that we will plan to leave the drain in place for now.  I discussed with the patient to continue with compression and activity restrictions.  I instructed the patient to call us if she experiences any fevers, chills, worsening redness to the area, increased pain or any other worsening symptoms to her right breast.  Patient expressed understanding.  Patient to follow-up next Monday.  I instructed patient to call if she has any questions or concerns.  Pictures were obtained with the patient and placed in the patient's chart with the patient's permission.  Objective findings and plan were discussed with Dr. Marla Roe.

## 2021-11-14 ENCOUNTER — Ambulatory Visit: Payer: Commercial Managed Care - PPO | Admitting: Physician Assistant

## 2021-11-19 ENCOUNTER — Ambulatory Visit (INDEPENDENT_AMBULATORY_CARE_PROVIDER_SITE_OTHER): Payer: Commercial Managed Care - PPO | Admitting: Physician Assistant

## 2021-11-19 DIAGNOSIS — D0512 Intraductal carcinoma in situ of left breast: Secondary | ICD-10-CM

## 2021-11-19 MED ORDER — CEPHALEXIN 500 MG PO CAPS
500.0000 mg | ORAL_CAPSULE | Freq: Four times a day (QID) | ORAL | 0 refills | Status: DC
Start: 2021-11-19 — End: 2021-12-04

## 2021-11-19 NOTE — Progress Notes (Signed)
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 She has now had bilateral mastectomies with immediate reconstruction.We discussed the concept of chemoprevention but this doesn't really apply since she no longer has breasts. She does not need further treatment.  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 She will need to get a baseline colonoscopy and repeat every 5 years. I would also recommend regular follow up of her previously mentioned thyroid nodule.   I have reviewed all of this information with the patient and she does not need further treatment at this time. She knows she will need baseline colonoscopy and close GYN follow up. I will plan to see in 3 months. I discussed the assessment and treatment plan with the patient.  The patient was provided an opportunity to ask questions and all were answered.  The patient agreed with the plan and demonstrated an understanding of the instructions.  The patient was advised to call back if the symptoms worsen or if the condition fails to improve as anticipated.  Thank you for the opportunity to participate in the care of your patients.   I provided 60 minutes of face-to-face time during this this encounter and > 50% was spent counseling as documented under my assessment and plan.    Derwood Kaplan, MD [facility] [address] [place] [address] Dept: [phone] Dept Fax: [phone]   Derwood Kaplan, MD  Orders Placed This Encounter  Procedures   CBC and differential    This external order was created through the Results Console.   CBC    This external order was  created through the Results Console.   Basic metabolic panel    This external order was created through the Results Console.   Comprehensive metabolic panel    This external order was created through the Results Console.   Hepatic function panel    This external order was created through the Results Console.       CHIEF COMPLAINTS/PURPOSE OF CONSULTATION:  Left breast cancer  HISTORY OF PRESENTING ILLNESS:  Shelly Hensley 41 y.o. female is here because of recent diagnosis of left breast DCIS.   The cancer was detected by screening mammogram, done 05/11/21. She had a 7 mm cluster of calcifications in the lower left breast. The cancer was not palpable prior to diagnosis. She had a diagnostic mammogram 06/13/21, which revealed the breast tissue to be very dense. This confirmed a group of indeterminate micro-calcifications, measuring 5 mm, which were mildly suspicious. A stereotactic biopsy was performed on 06/25/21, revealing a tiny area of DCIS. A repeat biopsy on 07/11/21 revealed DCIS intermediate grade, with estrogen receptors 100% and progesterone receptors 50%. Breast MRI on 08/13/21 confirmed a 4 mm area of enhancement at the biopsy site and no other abnormalities. 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  She had bilateral mastectomies on 10/02/21 with immediate reconstruction by Dr. Marla Roe. The right breast was negative.  The left breast revealed a solid type DCIS with focal necrosis, in a background of Emmons (pseudoangiomatous stromal hyperplasia). This involved 5 of 13 blocks.The deep margin was positive but the final pathology showed clear margins. Estrogen receptors were positive at 95% and PR at 40%. Dr. Lilia Pro is her surgeon.   I reviewed her records extensively and  collaborated the history with the patient.  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  Variant of uncertain significance detected in CTNNA1 at  p.N169S (c.506A>G).  No other pathogenic or uncertain variants detected in [place] [facility]RNAinsight Panel.  Report date is Jul 23, 2021.   The CancerNext-Expanded gene panel offered by [facility] and includes sequencing, rearrangement, and RNA analysis for the following 77 genes: AIP, ALK, APC, ATM, AXIN2, BAP1, BARD1, BLM, BMPR1A, BRCA1, BRCA2, BRIP1, CDC73, CDH1, CDK4, CDKN1B, CDKN2A, CHEK2, CTNNA1, DICER1, FANCC, FH, FLCN, GALNT12, KIF1B, LZTR1, MAX, MEN1, MET, MLH1, MSH2, MSH3, MSH6, MUTYH, NBN, NF1, NF2, NTHL1, PALB2, PHOX2B, PMS2, POT1, PRKAR1A, PTCH1, PTEN, RAD51C, RAD51D, RB1, RECQL, RET, SDHA, SDHAF2, SDHB, SDHC, SDHD, SMAD4, SMARCA4, SMARCB1, SMARCE1, STK11, SUFU, TMEM127, TP53, TSC1, TSC2, VHL and XRCC2 (sequencing and deletion/duplication); EGFR, EGLN1, HOXB13, KIT, MITF, PDGFRA, POLD1, and POLE (sequencing only); EPCAM and GREM1 (deletion/duplication only).      In terms of breast cancer risk profile:  She menarched at early age of 41 and still has regular menses. She had 1 pregnancy, her first child was born at age 11  She  received birth control pills for approximately years.  She was never exposed to fertility medications or hormone replacement therapy.  She has a strong family history of GYN/GI cancers  INTERVAL HISTORY: Currently, she is healing well after surgery apart from mild discomfort. She has no concern for local infection. 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  No other pathogenic variants detected in [facility] [facility] Panel.  Report date is Jul 23, 2021.   The BRCAplus panel offered by [employer] and includes sequencing and deletion/duplication analysis for the following 8 genes: ATM, BRCA1, BRCA2, CDH1, CHEK2, PALB2, PTEN, and TP53.  Results of pan-cancer panel are pending.    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  Thyroid nodule Allergic rhinitis  SURGICAL HISTORY: Past Surgical History:  Procedure Laterality Date   BREAST RECONSTRUCTION WITH PLACEMENT OF TISSUE EXPANDER AND FLEX HD (ACELLULAR HYDRATED DERMIS) Bilateral 10/02/2021   Procedure: BREAST RECONSTRUCTION WITH PLACEMENT OF TISSUE EXPANDER AND FLEX HD (ACELLULAR HYDRATED DERMIS);  Surgeon: Wallace Going, DO;  Location: [place];  Service: Plastics;  Laterality: Bilateral;   REMOVAL OF TISSUE EXPANDER Right 11/07/2021   Procedure: REMOVAL OF RIGHT TISSUE EXPANDER;  Surgeon: Wallace Going, DO;  Location: [place];  Service: Plastics;  Laterality: Right;   SIMPLE MASTECTOMY WITH AXILLARY SENTINEL NODE BIOPSY Bilateral 10/02/2021   Procedure: SIMPLE MASTECTOMY;  Surgeon: Pollyann Samples, MD;  Location: [place];  Service: General;  Laterality: Bilateral;    SOCIAL HISTORY: Social History   Socioeconomic History   Marital status: Married    Spouse name: Brandin   Number of children: 1   Years of education: 12+2   Highest education level: Associate degree: academic program  Occupational History   Not on file  Tobacco Use   Smoking status: Never   Smokeless tobacco: Never  Vaping Use   Vaping Use: Never used  Substance and Sexual Activity   Alcohol use: Never   Drug use: Never   Sexual activity: Yes  Other  Topics Concern   Not on file  Social History Narrative   Not on file   Social Determinants of Health   Financial Resource Strain: Not on file  Food Insecurity: Not on file  Transportation Needs: Not on file  Physical Activity: Not on file  Stress: Not on file  Social Connections: Not on file  Intimate Partner Violence: Not on file    FAMILY HISTORY: Family History  Problem Relation Age of Onset   Skin cancer Father        face; surgery only   Colon cancer Maternal Aunt        dx before 61?   Cancer Maternal Uncle        larynx; dx after 86   Lung cancer Paternal Aunt        dx after 97   Lung cancer Paternal Uncle        dx after 64   Colon cancer Cousin        dx 72s; paternal female cousin   Ovarian cancer Cousin        dx 19s; paternal cousin  Maternal uncle had a neuroendocrine tumor which metastasized to the liver and brain  ALLERGIES:  is allergic to other.  MEDICATIONS:  Current Outpatient Medications  Medication Sig Dispense Refill   loratadine (CLARITIN) 10 MG tablet Take 10 mg by mouth daily.     acetaminophen (TYLENOL) 500 MG tablet Take 500-1,000 mg by mouth 2 (two) times daily as needed for mild pain.     ALOE VERA PO Take 1 capsule by mouth daily.     Cholecalciferol (VITAMIN D-3 PO) Take 1 capsule by mouth daily.     diazepam (VALIUM) 2 MG tablet Take 1 tablet (2 mg total) by mouth every 12 (twelve) hours as needed for muscle spasms. 20 tablet 0   fluconazole (DIFLUCAN) 150 MG tablet Take 1 tablet (150 mg total) by mouth daily. 1 tablet 0   ibuprofen (ADVIL) 200 MG tablet Take 600 mg by mouth 2 (two) times daily as needed for headache or mild pain.     ondansetron (ZOFRAN) 4 MG tablet Take 1 tablet (4 mg total) by mouth every 8 (eight) hours as needed for nausea or vomiting. 20 tablet 0   OVER THE COUNTER MEDICATION Take 1 capsule by mouth daily. Juice Plus     No current facility-administered medications for this visit.    REVIEW OF SYSTEMS:    Constitutional: Denies fevers, chills or abnormal night sweats Eyes: Denies blurriness of vision, double vision or watery eyes Ears, nose, mouth, throat, and face: Denies mucositis or sore throat Respiratory: Denies cough, dyspnea or wheezes Cardiovascular: Denies palpitation, chest discomfort or lower extremity swelling Gastrointestinal:  Denies nausea, heartburn or change in bowel habits Skin: Denies abnormal skin rashes Lymphatics: Denies new lymphadenopathy or easy bruising Neurological:Denies numbness, tingling or new weaknesses Behavioral/Psych: Mood is stable, no new  changes  All other systems were reviewed with the patient and are negative.  PHYSICAL EXAMINATION: ECOG PERFORMANCE STATUS: 1 - Symptomatic but completely ambulatory  Vitals:   10/26/21 1053  BP: 113/85  Pulse: 68  Resp: 18  Temp: 98.2 F (36.8 C)  SpO2: 100%   Filed Weights   10/26/21 1053  Weight: 138 lb 14.4 oz (63 kg)    GENERAL:alert, no distress and comfortable SKIN: skin color, texture, turgor are normal, no rashes or significant lesions EYES: normal, conjunctiva are pink and non-injected, sclera clear OROPHARYNX:no exudate, no erythema and lips, buccal mucosa, and tongue normal  NECK: supple, thyroid normal size, non-tender, without nodularity LYMPH:  no palpable lymphadenopathy in the cervical, axillary or inguinal LUNGS: clear to auscultation and percussion with normal breathing effort HEART: regular rate & rhythm and no murmurs and no lower extremity edema ABDOMEN:abdomen soft, non-tender and normal bowel sounds Musculoskeletal:no cyanosis of digits and no clubbing  PSYCH: alert & oriented x 3 with fluent speech NEURO: no focal motor/sensory deficits  LABORATORY DATA:  I have reviewed the data as listed Lab Results  Component Value Date   WBC 5.3 11/09/2021   HGB 9.0 (L) 11/09/2021   HCT 26.5 (L) 11/09/2021   MCV 95.0 11/09/2021   PLT 170 11/09/2021   Lab Results  Component Value  Date   NA 139 11/09/2021   K 3.5 11/09/2021   CL 107 11/09/2021   CO2 21 (L) 11/09/2021    RADIOGRAPHIC STUDIES: I have personally reviewed the radiological images as listed and agreed with the findings in the report. CT Chest W Contrast  Result Date: 11/06/2021 CLINICAL DATA:  Syncopal episode today, fevers, redness under right breast; mastectomy in July EXAM: CT CHEST WITH CONTRAST TECHNIQUE: Multidetector CT imaging of the chest was performed during intravenous contrast administration. RADIATION DOSE REDUCTION: This exam was performed according to the departmental dose-optimization program which includes automated exposure control, adjustment of the mA and/or kV according to patient size and/or use of iterative reconstruction technique. CONTRAST:  4m OMNIPAQUE IOHEXOL 300 MG/ML  SOLN COMPARISON:  None Available. FINDINGS: Cardiovascular: No significant vascular findings. Normal heart size. No pericardial effusion. Mediastinum/Nodes: No enlarged mediastinal, hilar, or axillary lymph nodes. Thyroid gland, trachea, and esophagus demonstrate no significant findings. Lungs/Pleura: Bibasilar scarring/atelectasis. No pleural effusion or pneumothorax. Upper Abdomen: No acute abnormality. Musculoskeletal: Bilateral saline breast implants. There is a fluid collection around the right implant extending inferiorly and medially to the implant. The fluid collection demonstrates thick enhancing walls and measures approximately 3.1 x 2.7 x 9.2 cm mild adjacent edema and fat stranding. No acute osseous abnormality. IMPRESSION: Thick-walled peripherally enhancing fluid collection about the right breast implant concerning for abscess. Electronically Signed   By: TPlacido SouM.D.   On: 11/06/2021 01:24

## 2021-11-19 NOTE — Progress Notes (Signed)
This is a 41 year old female with a past medical history right-sided breast cancer who underwent bilateral mastectomy with immediate breast reconstruction with placement of Flex HD and tissue expanders on 10/02/2021.  The patient most recently presented to [facility] emergency room on 11/05/2021 with generalized fatigue with syncopal episode and right sided breast swelling and redness.  She was admitted to the hospital in observation and IV antibiotics.  During her hospitalization she underwent removal of right breast expander by Dr. Marla Roe on 11/07/2021.  She was discharged from the hospital on 11/09/2021.  She was most recently seen in our office on 11/13/2021 after being discharged from the hospital.  She notes the redness in her breast had improved.  She was taking Keflex at that time.  Since her last office visit she is been doing well, she notes the redness has significantly improved and does not feel she has any significant signs of infection at this time.  She has had very minimal drain output.  She denies any fever chills nausea or vomiting.  Chaperone present on exam.  On exam the patient sitting upright in no acute distress, left expander in place with no fluid collections, redness, warmth, flaps are viable.  On the right there is very minimal erythema noted along the right incision, no significant warmth to touch, no discharge, minimal induration along the incision.  The drain is in place with minimal amount of serous output, flaps are viable with no ecchymosis.  Overall the patient is doing well, she has no signs of systemic illness, she has some minimal redness to the right breast, this is likely more associated with the most recent infection and skin changes and less likely active infection.  But given the exam findings and recent significant infection I do feel it is reasonable to extend her antibiotic dose out for another 5 days which would be a total of 14 days of antibiotics.  The patient  will follow-up in our clinic this Friday for repeat evaluation, she will follow-up immediately if she develops any new or worsening signs or symptoms.  Patient verbalized understanding and agreement to this plan had no further questions or concerns.

## 2021-11-23 ENCOUNTER — Ambulatory Visit (INDEPENDENT_AMBULATORY_CARE_PROVIDER_SITE_OTHER): Payer: Commercial Managed Care - PPO | Admitting: Student

## 2021-11-23 DIAGNOSIS — D0512 Intraductal carcinoma in situ of left breast: Secondary | ICD-10-CM

## 2021-11-23 NOTE — Progress Notes (Signed)
This is a 41 year old female with a past medical history right-sided breast cancer who underwent bilateral mastectomy with immediate breast reconstruction with placement of Flex HD and tissue expanders on 10/02/2021.  The patient most recently presented to [facility] emergency room on 11/05/2021 with generalized fatigue with syncopal episode and right sided breast swelling and redness.  She was admitted to the hospital in observation and IV antibiotics.  During her hospitalization she underwent removal of right breast expander by Dr. Marla Roe on 11/07/2021.  She was discharged from the hospital on 11/09/2021.  Patient was last seen in the clinic on 11/19/2021.  At this visit, patient reported she is doing well.  On exam, there was some mild erythema noted along the right incision with no significant warmth to touch or discharge.  The drain was in place with a minimal amount of serous output.  Patient was given another dose of antibiotics for 5 days.  Today, patient reports she is doing well.  She states she is feeling better.  She denies any issues since she was last seen in clinic.  She denies any fevers or chills.  Patient states that she has had approximately 5 cc/day out of her drain.  Patient reports she has approximately 1 day left with her antibiotics.  Chaperone present on exam.  On exam, patient is sitting upright in no acute distress.  The left breast expander is in place, and the incision appears to be healing well.  The incision to her right breast is intact and healing well.  The erythema to the right lateral breast is much improved from her previous exams, and nearly gone.  There is some minimal induration to the inferior aspect of her right breast.  There is no warmth to the touch, and no significant fluid collections palpated on exam.  JP drain is in place to the right breast, with approximately 2 to 4 cc of serous drainage.  The right drain was removed and patient tolerated well.  Vaseline and  gauze were placed over the drain site.  I discussed with patient that she could place Vaseline and gauze over the drain site.  I discussed with the patient that she may transition into a compressive sports bra.  Dr. Marla Roe also had the opportunity to examine the patient and discussed with her the next steps, including putting another expander into the right breast.  This would most likely happen in approximately 2 to 3 weeks.  Patient was in agreement with this plan.  I instructed the patient to call if she has any questions or concerns.  Patient to follow-up in 2 weeks.

## 2021-11-28 ENCOUNTER — Telehealth: Payer: Self-pay | Admitting: *Deleted

## 2021-11-28 NOTE — Telephone Encounter (Signed)
Patient called to inquire about surgery. I dont have a route, message sent to [facility] and Dr. Marla Roe for further information.

## 2021-12-01 ENCOUNTER — Telehealth: Payer: Self-pay | Admitting: *Deleted

## 2021-12-01 NOTE — Telephone Encounter (Signed)
Auth request submitted via [facility] portal for CPT [id number], [id number], [id number]  Prior authorization and predetermination are not a guarantee of benefits.Please contact the benefits department to verify coverage and benefit information for the member. Transaction Submission Confirmation Case ID# [id number] was submitted on 12-01-2021 by [email or web address]

## 2021-12-04 ENCOUNTER — Telehealth: Payer: Self-pay | Admitting: *Deleted

## 2021-12-04 ENCOUNTER — Other Ambulatory Visit: Payer: Self-pay

## 2021-12-04 ENCOUNTER — Encounter (HOSPITAL_BASED_OUTPATIENT_CLINIC_OR_DEPARTMENT_OTHER): Payer: Self-pay | Admitting: Plastic Surgery

## 2021-12-04 NOTE — Telephone Encounter (Signed)
[facility] Determination: Case ID:  [id number], Patient has been scheduled for surgery 12/12/21

## 2021-12-06 ENCOUNTER — Encounter: Payer: Self-pay | Admitting: Surgical

## 2021-12-06 ENCOUNTER — Ambulatory Visit (INDEPENDENT_AMBULATORY_CARE_PROVIDER_SITE_OTHER): Payer: Commercial Managed Care - PPO | Admitting: Surgical

## 2021-12-06 VITALS — BP 153/94 | HR 72 | Ht 62.5 in | Wt 135.0 lb

## 2021-12-06 DIAGNOSIS — D0512 Intraductal carcinoma in situ of left breast: Secondary | ICD-10-CM

## 2021-12-06 MED ORDER — FLUCONAZOLE 150 MG PO TABS: 150.0000 mg | ORAL_TABLET | Freq: Once | ORAL | 1 refills | Status: AC

## 2021-12-06 MED ORDER — CLINDAMYCIN HCL 300 MG PO CAPS
300.0000 mg | ORAL_CAPSULE | Freq: Three times a day (TID) | ORAL | 0 refills | Status: AC
Start: 2021-12-06 — End: 2021-12-11

## 2021-12-06 NOTE — Progress Notes (Signed)
Patient ID: Shelly Hensley, female    DOB: 12-31-1980, 41 y.o.   MRN: [id number]  Chief Complaint  Patient presents with   Pre-op Exam      ICD-10-CM   1. Ductal carcinoma in situ (DCIS) of left breast  D05.12       History of Present Illness: Shelly Hensley is a 41 y.o.  female  with a history of bilateral breast reconstruction with subsequent removal of the right breast tissue expander due to infection.  She presents for preoperative evaluation for upcoming procedure, replacement of right breast tissue expander for right breast reconstruction, scheduled for 12/12/2021 with Dr. Marla Roe.  Patient reports history of PONV, but reports this was much improved after her last surgery with scopolamine patch.  No other issues with anesthesia.  No history of DVT/PE. No family or personal history of bleeding or clotting disorders.  Patient is not currently taking any blood thinners.  No history of CVA/MI.  Patient reports that her father had a history of blood clots in the past, but reports this was related to him having a significant illness and being hospitalized.  Summary of Previous Visit: Patient underwent bilateral breast reconstruction placement of breast tissue expanders and Flex HD on 10/02/2021, subsequently underwent removal of right breast tissue expander by Dr. Marla Roe on 11/07/2021.  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  Past Medical History: Allergies: Allergies  Allergen Reactions   Other Anaphylaxis and Other (See Comments)    Tree nuts    Current Medications:  Current Outpatient Medications:    acetaminophen (TYLENOL) 500 MG tablet, Take 500-1,000 mg by mouth 2 (two) times daily as needed for mild pain., Disp: , Rfl:    ALOE VERA PO, Take 1 capsule by mouth daily., Disp: ,  Rfl:    Cholecalciferol (VITAMIN D-3 PO), Take 1 capsule by mouth daily., Disp: , Rfl:    clindamycin (CLEOCIN) 300 MG capsule, Take 1 capsule (300 mg total) by mouth 3 (three) times daily for 5 days., Disp: 15 capsule, Rfl: 0   diazepam (VALIUM) 2 MG tablet, Take 1 tablet (2 mg total) by mouth every 12 (twelve) hours as needed for muscle spasms., Disp: 20 tablet, Rfl: 0   fluconazole (DIFLUCAN) 150 MG tablet, Take 1 tablet (150 mg total) by mouth daily., Disp: 1 tablet, Rfl: 0   fluconazole (DIFLUCAN) 150 MG tablet, Take 1 tablet (150 mg total) by mouth once for 1 dose., Disp: 1 tablet, Rfl: 1   ibuprofen (ADVIL) 200 MG tablet, Take 600 mg by mouth 2 (two) times daily as needed for headache or mild pain., Disp: , Rfl:    loratadine (CLARITIN) 10 MG tablet, Take 10 mg by mouth daily., Disp: , Rfl:    ondansetron (ZOFRAN) 4 MG tablet, Take 1 tablet (4 mg total) by mouth every 8 (eight) hours as needed for nausea or vomiting., Disp: 20 tablet, Rfl: 0   OVER THE COUNTER MEDICATION, Take 1 capsule by mouth daily. 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  No other pathogenic variants detected in [facility] Panel.  Report date is Jul 23, 2021.   The BRCAplus panel offered by [employer] and includes sequencing and deletion/duplication analysis for the following 8 genes: ATM, BRCA1, BRCA2, CDH1, CHEK2, PALB2, PTEN, and TP53.  Results of pan-cancer panel are pending.    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   PONV (postoperative nausea and vomiting)    Seroma of breast    admitted with infection 11/05/21-11/09/21    Past Surgical History: Past Surgical History:  Procedure Laterality Date    BREAST RECONSTRUCTION WITH PLACEMENT OF TISSUE EXPANDER AND FLEX HD (ACELLULAR HYDRATED DERMIS) Bilateral 10/02/2021   Procedure: BREAST RECONSTRUCTION WITH PLACEMENT OF TISSUE EXPANDER AND FLEX HD (ACELLULAR HYDRATED DERMIS);  Surgeon: Wallace Going, DO;  Location: [place];  Service: Plastics;  Laterality: Bilateral;   REMOVAL OF TISSUE EXPANDER Right 11/07/2021   Procedure: REMOVAL OF RIGHT TISSUE EXPANDER;  Surgeon: Wallace Going, DO;  Location: [place];  Service: Plastics;  Laterality: Right;   SIMPLE MASTECTOMY WITH AXILLARY SENTINEL NODE BIOPSY Bilateral 10/02/2021   Procedure: SIMPLE MASTECTOMY;  Surgeon: Pollyann Samples, MD;  Location: [place];  Service: General;  Laterality: Bilateral;    Social History: Social History   Socioeconomic History   Marital status: Married    Spouse name: Brandin   Number of children: 1   Years of education: 12+2   Highest education level: Associate degree: academic program  Occupational History   Not on file  Tobacco Use   Smoking status: Never   Smokeless tobacco: Never  Vaping Use   Vaping Use: Never used  Substance and Sexual Activity   Alcohol use: Never   Drug use: Never   Sexual activity: Yes  Other Topics Concern   Not on file  Social History Narrative   Not on file   Social Determinants of Health   Financial Resource Strain: Not on file  Food Insecurity: Not on file  Transportation Needs: Not on file  Physical Activity: Not on file  Stress: Not on file  Social Connections: Not on file  Intimate Partner Violence: Not on file    Family History: Family History  Problem Relation Age of Onset   Skin cancer Father        face; surgery only   Colon cancer Maternal Aunt        dx before 1?   Cancer Maternal Uncle        larynx; dx after 82   Lung cancer Paternal Aunt        dx after 59   Lung cancer Paternal Uncle        dx after 27   Colon cancer Cousin        dx 24s;  paternal female cousin   Ovarian cancer Cousin        dx 54s; paternal cousin    Review of Systems: Review of Systems  Constitutional: Negative.   Respiratory: Negative.    Cardiovascular: Negative.   Gastrointestinal: Negative.   Neurological: Negative.     Physical Exam: Vital Signs BP (!) 153/94 (BP Location: Left Arm, Patient Position: Sitting, Cuff Size: Normal)   Pulse 72   Ht 5' 2.5" (1.588 m)   Wt 135 lb (61.2 kg)   LMP 10/31/2021 (Exact Date)   SpO2 99%   BMI 24.30 kg/m   Physical Exam  Constitutional:  General: Not in acute distress.    Appearance: Normal appearance. Not ill-appearing.  HENT:     Head: Normocephalic and atraumatic.  Eyes:     Pupils: Pupils are equal, round Neck:     Musculoskeletal: Normal range of motion.  Cardiovascular:     Rate and Rhythm: Normal rate    Pulses: Normal pulses.  Pulmonary:     Effort: Pulmonary effort is normal. No respiratory distress.  Abdominal:     General: Abdomen is flat. There is no distension.  Musculoskeletal: Normal range of motion.  Skin:    General: Skin is warm and dry.     Findings: No erythema or rash.  Neurological:     General: No focal deficit present.     Mental Status: Alert and oriented to person, place, and time. Mental status is at baseline.     Motor: No weakness.  Psychiatric:        Mood and Affect: Mood normal.        Behavior: Behavior normal.    Assessment/Plan: The patient is scheduled for placement of right breast tissue expander for right breast reconstruction with Dr. Marla Roe.  Risks, benefits, and alternatives of procedure discussed, questions answered and consent obtained.    Smoking Status: Non-smoker; Counseling Given?  N/A Last Mammogram: Status post bilateral mastectomy  Caprini Score: 8, high; Risk Factors include: Age, history of cancer, and length of planned surgery and father with history of DVT. Recommendation for mechanical prophylaxis. Encourage early  ambulation.  Given that her father's DVT was provoked by significant illness while in ICU, will plan for early ambulation and SCDs.  Pictures obtained: Pictures were obtained of the patient and placed in the chart with the patient's or guardian's permission.  Post-op Rx sent to pharmacy:  Clindamycin -guided by cultures.  Fluconazole due to patient's history of yeast infections with antibiotic use.  Patient was provided with the breast reconstruction and General Surgical Risk consent document and Pain Medication Agreement prior to their appointment.  They had adequate time to read through the risk consent documents and Pain Medication Agreement. We also discussed them in person together during this preop appointment. All of their questions were answered to their satisfaction.  Recommended calling if they have any further questions.  Risk consent form and Pain Medication Agreement to be scanned into patient's chart.  The risks that can be encountered with and after placement of a breast expander placement were discussed and include the following but not limited to these: bleeding, infection, delayed healing, anesthesia risks, skin sensation changes, injury to structures including nerves, blood vessels, and muscles which may be temporary or permanent, allergies to tape, suture materials and glues, blood products, topical preparations or injected agents, skin contour irregularities, skin discoloration and swelling, deep vein thrombosis, cardiac and pulmonary complications, pain, which may persist, fluid accumulation, wrinkling of the skin over the expander, changes in nipple or breast sensation, expander leakage or rupture, faulty position of the expander, persistent pain, formation of tight scar tissue around the expander (capsular contracture), possible need for revisional surgery or staged procedures.     Electronically signed by: Carola Rhine Handsome Anglin, PA-C 12/06/2021 10:49 AM

## 2021-12-06 NOTE — H&P (View-Only) (Signed)
Patient ID: Shelly Hensley, female    DOB: 14-May-1980, 41 y.o.   MRN: [id number]  Chief Complaint  Patient presents with   Pre-op Exam      ICD-10-CM   1. Ductal carcinoma in situ (DCIS) of left breast  D05.12       History of Present Illness: Shelly Hensley is a 40 y.o.  female  with a history of bilateral breast reconstruction with subsequent removal of the right breast tissue expander due to infection.  She presents for preoperative evaluation for upcoming procedure, replacement of right breast tissue expander for right breast reconstruction, scheduled for 12/12/2021 with Dr. Marla Roe.  Patient reports history of PONV, but reports this was much improved after her last surgery with scopolamine patch.  No other issues with anesthesia.  No history of DVT/PE. No family or personal history of bleeding or clotting disorders.  Patient is not currently taking any blood thinners.  No history of CVA/MI.  Patient reports that her father had a history of blood clots in the past, but reports this was related to him having a significant illness and being hospitalized.  Summary of Previous Visit: Patient underwent bilateral breast reconstruction placement of breast tissue expanders and Flex HD on 10/02/2021, subsequently underwent removal of right breast tissue expander by Dr. Marla Roe on 11/07/2021.  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  Past Medical History: Allergies: Allergies  Allergen Reactions   Other Anaphylaxis and Other (See Comments)    Tree nuts    Current Medications:  Current Outpatient Medications:    acetaminophen (TYLENOL) 500 MG tablet, Take 500-1,000 mg by mouth 2 (two) times daily as needed for mild pain., Disp: , Rfl:    ALOE VERA PO, Take 1 capsule by mouth daily., Disp: ,  Rfl:    Cholecalciferol (VITAMIN D-3 PO), Take 1 capsule by mouth daily., Disp: , Rfl:    clindamycin (CLEOCIN) 300 MG capsule, Take 1 capsule (300 mg total) by mouth 3 (three) times daily for 5 days., Disp: 15 capsule, Rfl: 0   diazepam (VALIUM) 2 MG tablet, Take 1 tablet (2 mg total) by mouth every 12 (twelve) hours as needed for muscle spasms., Disp: 20 tablet, Rfl: 0   fluconazole (DIFLUCAN) 150 MG tablet, Take 1 tablet (150 mg total) by mouth daily., Disp: 1 tablet, Rfl: 0   fluconazole (DIFLUCAN) 150 MG tablet, Take 1 tablet (150 mg total) by mouth once for 1 dose., Disp: 1 tablet, Rfl: 1   ibuprofen (ADVIL) 200 MG tablet, Take 600 mg by mouth 2 (two) times daily as needed for headache or mild pain., Disp: , Rfl:    loratadine (CLARITIN) 10 MG tablet, Take 10 mg by mouth daily., Disp: , Rfl:    ondansetron (ZOFRAN) 4 MG tablet, Take 1 tablet (4 mg total) by mouth every 8 (eight) hours as needed for nausea or vomiting., Disp: 20 tablet, Rfl: 0   OVER THE COUNTER MEDICATION, Take 1 capsule by mouth daily. 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  No other pathogenic variants detected in [facility] Panel.  Report date is Jul 23, 2021.   The BRCAplus panel offered by [employer] and includes sequencing and deletion/duplication analysis for the following 8 genes: ATM, BRCA1, BRCA2, CDH1, CHEK2, PALB2, PTEN, and TP53.  Results of pan-cancer panel are pending.    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   PONV (postoperative nausea and vomiting)    Seroma of breast    admitted with infection 11/05/21-11/09/21    Past Surgical History: Past Surgical History:  Procedure Laterality Date    BREAST RECONSTRUCTION WITH PLACEMENT OF TISSUE EXPANDER AND FLEX HD (ACELLULAR HYDRATED DERMIS) Bilateral 10/02/2021   Procedure: BREAST RECONSTRUCTION WITH PLACEMENT OF TISSUE EXPANDER AND FLEX HD (ACELLULAR HYDRATED DERMIS);  Surgeon: Wallace Going, DO;  Location: [place];  Service: Plastics;  Laterality: Bilateral;   REMOVAL OF TISSUE EXPANDER Right 11/07/2021   Procedure: REMOVAL OF RIGHT TISSUE EXPANDER;  Surgeon: Wallace Going, DO;  Location: [place];  Service: Plastics;  Laterality: Right;   SIMPLE MASTECTOMY WITH AXILLARY SENTINEL NODE BIOPSY Bilateral 10/02/2021   Procedure: SIMPLE MASTECTOMY;  Surgeon: Pollyann Samples, MD;  Location: [place];  Service: General;  Laterality: Bilateral;    Social History: Social History   Socioeconomic History   Marital status: Married    Spouse name: Brandin   Number of children: 1   Years of education: 12+2   Highest education level: Associate degree: academic program  Occupational History   Not on file  Tobacco Use   Smoking status: Never   Smokeless tobacco: Never  Vaping Use   Vaping Use: Never used  Substance and Sexual Activity   Alcohol use: Never   Drug use: Never   Sexual activity: Yes  Other Topics Concern   Not on file  Social History Narrative   Not on file   Social Determinants of Health   Financial Resource Strain: Not on file  Food Insecurity: Not on file  Transportation Needs: Not on file  Physical Activity: Not on file  Stress: Not on file  Social Connections: Not on file  Intimate Partner Violence: Not on file    Family History: Family History  Problem Relation Age of Onset   Skin cancer Father        face; surgery only   Colon cancer Maternal Aunt        dx before 20?   Cancer Maternal Uncle        larynx; dx after 78   Lung cancer Paternal Aunt        dx after 80   Lung cancer Paternal Uncle        dx after 21   Colon cancer Cousin        dx 52s;  paternal female cousin   Ovarian cancer Cousin        dx 32s; paternal cousin    Review of Systems: Review of Systems  Constitutional: Negative.   Respiratory: Negative.    Cardiovascular: Negative.   Gastrointestinal: Negative.   Neurological: Negative.     Physical Exam: Vital Signs BP (!) 153/94 (BP Location: Left Arm, Patient Position: Sitting, Cuff Size: Normal)   Pulse 72   Ht 5' 2.5" (1.588 m)   Wt 135 lb (61.2 kg)   LMP 10/31/2021 (Exact Date)   SpO2 99%   BMI 24.30 kg/m   Physical Exam  Constitutional:  General: Not in acute distress.    Appearance: Normal appearance. Not ill-appearing.  HENT:     Head: Normocephalic and atraumatic.  Eyes:     Pupils: Pupils are equal, round Neck:     Musculoskeletal: Normal range of motion.  Cardiovascular:     Rate and Rhythm: Normal rate    Pulses: Normal pulses.  Pulmonary:     Effort: Pulmonary effort is normal. No respiratory distress.  Abdominal:     General: Abdomen is flat. There is no distension.  Musculoskeletal: Normal range of motion.  Skin:    General: Skin is warm and dry.     Findings: No erythema or rash.  Neurological:     General: No focal deficit present.     Mental Status: Alert and oriented to person, place, and time. Mental status is at baseline.     Motor: No weakness.  Psychiatric:        Mood and Affect: Mood normal.        Behavior: Behavior normal.    Assessment/Plan: The patient is scheduled for placement of right breast tissue expander for right breast reconstruction with Dr. Marla Roe.  Risks, benefits, and alternatives of procedure discussed, questions answered and consent obtained.    Smoking Status: Non-smoker; Counseling Given?  N/A Last Mammogram: Status post bilateral mastectomy  Caprini Score: 8, high; Risk Factors include: Age, history of cancer, and length of planned surgery and father with history of DVT. Recommendation for mechanical prophylaxis. Encourage early  ambulation.  Given that her father's DVT was provoked by significant illness while in ICU, will plan for early ambulation and SCDs.  Pictures obtained: Pictures were obtained of the patient and placed in the chart with the patient's or guardian's permission.  Post-op Rx sent to pharmacy:  Clindamycin -guided by cultures.  Fluconazole due to patient's history of yeast infections with antibiotic use.  Patient was provided with the breast reconstruction and General Surgical Risk consent document and Pain Medication Agreement prior to their appointment.  They had adequate time to read through the risk consent documents and Pain Medication Agreement. We also discussed them in person together during this preop appointment. All of their questions were answered to their satisfaction.  Recommended calling if they have any further questions.  Risk consent form and Pain Medication Agreement to be scanned into patient's chart.  The risks that can be encountered with and after placement of a breast expander placement were discussed and include the following but not limited to these: bleeding, infection, delayed healing, anesthesia risks, skin sensation changes, injury to structures including nerves, blood vessels, and muscles which may be temporary or permanent, allergies to tape, suture materials and glues, blood products, topical preparations or injected agents, skin contour irregularities, skin discoloration and swelling, deep vein thrombosis, cardiac and pulmonary complications, pain, which may persist, fluid accumulation, wrinkling of the skin over the expander, changes in nipple or breast sensation, expander leakage or rupture, faulty position of the expander, persistent pain, formation of tight scar tissue around the expander (capsular contracture), possible need for revisional surgery or staged procedures.     Electronically signed by: Carola Rhine Johnthomas Lader, PA-C 12/06/2021 10:49 AM

## 2021-12-07 ENCOUNTER — Ambulatory Visit: Payer: Commercial Managed Care - PPO | Admitting: Surgical

## 2021-12-07 ENCOUNTER — Ambulatory Visit: Payer: Commercial Managed Care - PPO | Admitting: Student

## 2021-12-12 ENCOUNTER — Other Ambulatory Visit: Payer: Self-pay

## 2021-12-12 ENCOUNTER — Encounter (HOSPITAL_BASED_OUTPATIENT_CLINIC_OR_DEPARTMENT_OTHER)
Admission: RE | Disposition: A | Discharge status: Home / Self Care | Payer: Self-pay | Source: Home / Self Care | Attending: Plastic Surgery

## 2021-12-12 ENCOUNTER — Ambulatory Visit (HOSPITAL_BASED_OUTPATIENT_CLINIC_OR_DEPARTMENT_OTHER): Payer: Commercial Managed Care - PPO | Admitting: Certified Registered"

## 2021-12-12 ENCOUNTER — Encounter (HOSPITAL_BASED_OUTPATIENT_CLINIC_OR_DEPARTMENT_OTHER): Payer: Self-pay | Admitting: Plastic Surgery

## 2021-12-12 ENCOUNTER — Ambulatory Visit (HOSPITAL_BASED_OUTPATIENT_CLINIC_OR_DEPARTMENT_OTHER)
Admission: RE | Admit: 2021-12-12 | Discharge: 2021-12-12 | Disposition: A | Discharge status: Home / Self Care | Payer: Commercial Managed Care - PPO | Attending: Plastic Surgery | Admitting: Plastic Surgery

## 2021-12-12 DIAGNOSIS — Z853 Personal history of malignant neoplasm of breast: Secondary | ICD-10-CM | POA: Diagnosis not present

## 2021-12-12 DIAGNOSIS — Z9011 Acquired absence of right breast and nipple: Secondary | ICD-10-CM | POA: Diagnosis not present

## 2021-12-12 DIAGNOSIS — Z01818 Encounter for other preprocedural examination: Secondary | ICD-10-CM

## 2021-12-12 DIAGNOSIS — D0512 Intraductal carcinoma in situ of left breast: Secondary | ICD-10-CM

## 2021-12-12 DIAGNOSIS — Z421 Encounter for breast reconstruction following mastectomy: Secondary | ICD-10-CM | POA: Diagnosis not present

## 2021-12-12 HISTORY — PX: BREAST RECONSTRUCTION WITH PLACEMENT OF TISSUE EXPANDER AND FLEX HD (ACELLULAR HYDRATED DERMIS): SHX6295

## 2021-12-12 HISTORY — DX: Nausea with vomiting, unspecified: R11.2

## 2021-12-12 HISTORY — DX: Other specified disorders of breast: N64.89

## 2021-12-12 HISTORY — DX: Nausea with vomiting, unspecified: Z98.890

## 2021-12-12 LAB — POCT PREGNANCY, URINE: Preg Test, Ur: NEGATIVE

## 2021-12-12 SURGERY — BREAST RECONSTRUCTION WITH PLACEMENT OF TISSUE EXPANDER AND FLEX HD (ACELLULAR HYDRATED DERMIS)
Anesthesia: General | Site: Breast | Laterality: Right

## 2021-12-12 MED ORDER — LIDOCAINE HCL (CARDIAC) PF 100 MG/5ML IV SOSY
PREFILLED_SYRINGE | INTRAVENOUS | Status: DC | PRN
  Administered 2021-12-12: 60 mg via INTRAVENOUS

## 2021-12-12 MED ORDER — DEXAMETHASONE SODIUM PHOSPHATE 4 MG/ML IJ SOLN
INTRAMUSCULAR | Status: DC | PRN
  Administered 2021-12-12: 10 mg via INTRAVENOUS

## 2021-12-12 MED ORDER — DEXAMETHASONE SODIUM PHOSPHATE 10 MG/ML IJ SOLN
INTRAMUSCULAR | Status: AC
  Filled 2021-12-12: qty 1

## 2021-12-12 MED ORDER — DIPHENHYDRAMINE HCL 50 MG/ML IJ SOLN
INTRAMUSCULAR | Status: AC
  Filled 2021-12-12: qty 1

## 2021-12-12 MED ORDER — SCOPOLAMINE 1 MG/3DAYS TD PT72
1.0000 | MEDICATED_PATCH | TRANSDERMAL | Status: DC
  Administered 2021-12-12: 1.5 mg via TRANSDERMAL

## 2021-12-12 MED ORDER — ONDANSETRON HCL 4 MG/2ML IJ SOLN
INTRAMUSCULAR | Status: AC
  Filled 2021-12-12: qty 2

## 2021-12-12 MED ORDER — ACETAMINOPHEN 500 MG PO TABS
ORAL_TABLET | ORAL | Status: AC
  Filled 2021-12-12: qty 2

## 2021-12-12 MED ORDER — FENTANYL CITRATE (PF) 100 MCG/2ML IJ SOLN
INTRAMUSCULAR | Status: DC | PRN
  Administered 2021-12-12 (×4): 25 ug via INTRAVENOUS

## 2021-12-12 MED ORDER — OXYCODONE HCL 5 MG PO TABS
ORAL_TABLET | ORAL | Status: AC
  Filled 2021-12-12: qty 1

## 2021-12-12 MED ORDER — FENTANYL CITRATE (PF) 100 MCG/2ML IJ SOLN
INTRAMUSCULAR | Status: AC
  Filled 2021-12-12: qty 2

## 2021-12-12 MED ORDER — SODIUM CHLORIDE 0.9% FLUSH: 3.0000 mL | Freq: Two times a day (BID) | INTRAVENOUS | Status: DC

## 2021-12-12 MED ORDER — LIDOCAINE 2% (20 MG/ML) 5 ML SYRINGE
INTRAMUSCULAR | Status: AC
  Filled 2021-12-12: qty 5

## 2021-12-12 MED ORDER — PROPOFOL 10 MG/ML IV BOLUS
INTRAVENOUS | Status: DC | PRN
  Administered 2021-12-12: 160 mg via INTRAVENOUS

## 2021-12-12 MED ORDER — SODIUM CHLORIDE 0.9% FLUSH: 3.0000 mL | INTRAVENOUS | Status: DC | PRN

## 2021-12-12 MED ORDER — SODIUM CHLORIDE 0.9 % IV SOLN: 250.0000 mL | INTRAVENOUS | Status: DC | PRN

## 2021-12-12 MED ORDER — ONDANSETRON HCL 4 MG/2ML IJ SOLN
INTRAMUSCULAR | Status: DC | PRN
  Administered 2021-12-12: 4 mg via INTRAVENOUS

## 2021-12-12 MED ORDER — ACETAMINOPHEN 325 MG RE SUPP: 650.0000 mg | RECTAL | Status: DC | PRN

## 2021-12-12 MED ORDER — EPHEDRINE SULFATE (PRESSORS) 50 MG/ML IJ SOLN
INTRAMUSCULAR | Status: DC | PRN
  Administered 2021-12-12: 10 mg via INTRAVENOUS

## 2021-12-12 MED ORDER — LACTATED RINGERS IV SOLN: INTRAVENOUS | Status: DC

## 2021-12-12 MED ORDER — CHLORHEXIDINE GLUCONATE CLOTH 2 % EX PADS: 6.0000 | MEDICATED_PAD | Freq: Once | CUTANEOUS | Status: DC

## 2021-12-12 MED ORDER — EPHEDRINE 5 MG/ML INJ
INTRAVENOUS | Status: AC
  Filled 2021-12-12: qty 5

## 2021-12-12 MED ORDER — ACETAMINOPHEN 325 MG PO TABS: 650.0000 mg | ORAL_TABLET | ORAL | Status: DC | PRN

## 2021-12-12 MED ORDER — PROPOFOL 10 MG/ML IV BOLUS
INTRAVENOUS | Status: AC
  Filled 2021-12-12: qty 20

## 2021-12-12 MED ORDER — OXYCODONE HCL 5 MG PO TABS: 5.0000 mg | ORAL_TABLET | ORAL | Status: DC | PRN

## 2021-12-12 MED ORDER — MIDAZOLAM HCL 2 MG/2ML IJ SOLN
INTRAMUSCULAR | Status: AC
  Filled 2021-12-12: qty 2

## 2021-12-12 MED ORDER — LIDOCAINE-EPINEPHRINE 1 %-1:100000 IJ SOLN
INTRAMUSCULAR | Status: DC | PRN
  Administered 2021-12-12: 10 mL via INTRAMUSCULAR

## 2021-12-12 MED ORDER — ONDANSETRON HCL 4 MG/2ML IJ SOLN: 4.0000 mg | Freq: Once | INTRAMUSCULAR | Status: DC | PRN

## 2021-12-12 MED ORDER — MIDAZOLAM HCL 5 MG/5ML IJ SOLN
INTRAMUSCULAR | Status: DC | PRN
  Administered 2021-12-12: 2 mg via INTRAVENOUS

## 2021-12-12 MED ORDER — SCOPOLAMINE 1 MG/3DAYS TD PT72
MEDICATED_PATCH | TRANSDERMAL | Status: AC
  Filled 2021-12-12: qty 1

## 2021-12-12 MED ORDER — OXYCODONE HCL 5 MG/5ML PO SOLN: 5.0000 mg | Freq: Once | ORAL | Status: AC | PRN

## 2021-12-12 MED ORDER — FENTANYL CITRATE (PF) 100 MCG/2ML IJ SOLN: 25.0000 ug | INTRAMUSCULAR | Status: DC | PRN

## 2021-12-12 MED ORDER — SODIUM CHLORIDE 0.9 % IV SOLN
INTRAVENOUS | Status: DC | PRN
  Administered 2021-12-12: 500 mL

## 2021-12-12 MED ORDER — CEFAZOLIN SODIUM-DEXTROSE 2-4 GM/100ML-% IV SOLN
2.0000 g | INTRAVENOUS | Status: AC
  Administered 2021-12-12: 2 g via INTRAVENOUS

## 2021-12-12 MED ORDER — ACETAMINOPHEN 500 MG PO TABS
1000.0000 mg | ORAL_TABLET | Freq: Once | ORAL | Status: AC
  Administered 2021-12-12: 1000 mg via ORAL

## 2021-12-12 MED ORDER — FENTANYL CITRATE (PF) 100 MCG/2ML IJ SOLN
25.0000 ug | INTRAMUSCULAR | Status: DC | PRN
  Administered 2021-12-12 (×2): 50 ug via INTRAVENOUS

## 2021-12-12 MED ORDER — CEFAZOLIN SODIUM-DEXTROSE 2-4 GM/100ML-% IV SOLN
INTRAVENOUS | Status: AC
  Filled 2021-12-12: qty 100

## 2021-12-12 MED ORDER — OXYCODONE HCL 5 MG PO TABS
5.0000 mg | ORAL_TABLET | Freq: Once | ORAL | Status: AC | PRN
  Administered 2021-12-12: 5 mg via ORAL

## 2021-12-12 MED ORDER — DEXMEDETOMIDINE HCL IN NACL 80 MCG/20ML IV SOLN
INTRAVENOUS | Status: DC | PRN
  Administered 2021-12-12: 8 ug via BUCCAL

## 2021-12-12 MED ORDER — DIPHENHYDRAMINE HCL 50 MG/ML IJ SOLN
12.5000 mg | Freq: Once | INTRAMUSCULAR | Status: AC
  Administered 2021-12-12: 12.5 mg via INTRAVENOUS

## 2021-12-12 SURGICAL SUPPLY — 62 items
ADH SKN CLS APL DERMABOND .7 (GAUZE/BANDAGES/DRESSINGS) ×1
BAG DECANTER FOR FLEXI CONT (MISCELLANEOUS) ×1 IMPLANT
BINDER BREAST LRG (GAUZE/BANDAGES/DRESSINGS) IMPLANT
BINDER BREAST MEDIUM (GAUZE/BANDAGES/DRESSINGS) IMPLANT
BINDER BREAST XLRG (GAUZE/BANDAGES/DRESSINGS) IMPLANT
BINDER BREAST XXLRG (GAUZE/BANDAGES/DRESSINGS) IMPLANT
BIOPATCH RED 1 DISK 7.0 (GAUZE/BANDAGES/DRESSINGS) ×1 IMPLANT
BLADE HEX COATED 2.75 (ELECTRODE) ×1 IMPLANT
BLADE SURG 15 STRL LF DISP TIS (BLADE) ×1 IMPLANT
BLADE SURG 15 STRL SS (BLADE) ×1
BNDG GAUZE DERMACEA FLUFF 4 (GAUZE/BANDAGES/DRESSINGS) IMPLANT
BNDG GZE DERMACEA 4 6PLY (GAUZE/BANDAGES/DRESSINGS)
CANISTER SUCT 1200ML W/VALVE (MISCELLANEOUS) ×1 IMPLANT
COVER BACK TABLE 60X90IN (DRAPES) ×1 IMPLANT
COVER MAYO STAND STRL (DRAPES) ×1 IMPLANT
DERMABOND ADVANCED .7 DNX12 (GAUZE/BANDAGES/DRESSINGS) ×1 IMPLANT
DRAIN CHANNEL 19F RND (DRAIN) IMPLANT
DRAPE LAPAROSCOPIC ABDOMINAL (DRAPES) ×1 IMPLANT
DRSG OPSITE POSTOP 4X6 (GAUZE/BANDAGES/DRESSINGS) IMPLANT
DRSG TEGADERM 2-3/8X2-3/4 SM (GAUZE/BANDAGES/DRESSINGS) IMPLANT
ELECT BLADE 4.0 EZ CLEAN MEGAD (MISCELLANEOUS) ×1
ELECT REM PT RETURN 9FT ADLT (ELECTROSURGICAL) ×1
ELECTRODE BLDE 4.0 EZ CLN MEGD (MISCELLANEOUS) ×1 IMPLANT
ELECTRODE REM PT RTRN 9FT ADLT (ELECTROSURGICAL) ×1 IMPLANT
EVACUATOR SILICONE 100CC (DRAIN) ×1 IMPLANT
GAUZE PAD ABD 8X10 STRL (GAUZE/BANDAGES/DRESSINGS) ×1 IMPLANT
GLOVE BIO SURGEON STRL SZ 6.5 (GLOVE) ×3 IMPLANT
GOWN STRL REUS W/ TWL LRG LVL3 (GOWN DISPOSABLE) ×3 IMPLANT
GOWN STRL REUS W/TWL LRG LVL3 (GOWN DISPOSABLE) ×3
IMPL EXPANDER BREAST 455CC (Breast) IMPLANT
IMPLANT EXPANDER BREAST 455CC (Breast) ×1 IMPLANT
IV NS 500ML (IV SOLUTION) ×1
IV NS 500ML BAXH (IV SOLUTION) ×1 IMPLANT
KIT FILL ASEPTIC TRANSFER (MISCELLANEOUS) IMPLANT
NDL HYPO 25X1 1.5 SAFETY (NEEDLE) ×1 IMPLANT
NEEDLE HYPO 25X1 1.5 SAFETY (NEEDLE) ×1 IMPLANT
PACK BASIN DAY SURGERY FS (CUSTOM PROCEDURE TRAY) ×1 IMPLANT
PAD FOAM SILICONE BACKED (GAUZE/BANDAGES/DRESSINGS) IMPLANT
PENCIL SMOKE EVACUATOR (MISCELLANEOUS) ×1 IMPLANT
PIN SAFETY STERILE (MISCELLANEOUS) ×1 IMPLANT
SLEEVE SCD COMPRESS KNEE MED (STOCKING) ×1 IMPLANT
SPIKE FLUID TRANSFER (MISCELLANEOUS) IMPLANT
SPONGE T-LAP 18X18 ~~LOC~~+RFID (SPONGE) ×1 IMPLANT
STRIP SUTURE WOUND CLOSURE 1/2 (MISCELLANEOUS) IMPLANT
SUT MNCRL AB 4-0 PS2 18 (SUTURE) IMPLANT
SUT MON AB 3-0 SH 27 (SUTURE) ×1
SUT MON AB 3-0 SH27 (SUTURE) IMPLANT
SUT MON AB 5-0 PS2 18 (SUTURE) IMPLANT
SUT PDS 3-0 CT2 (SUTURE) ×1
SUT PDS AB 2-0 CT2 27 (SUTURE) IMPLANT
SUT PDS II 3-0 CT2 27 ABS (SUTURE) IMPLANT
SUT SILK 3 0 PS 1 (SUTURE) IMPLANT
SUT VIC AB 3-0 SH 27 (SUTURE)
SUT VIC AB 3-0 SH 27X BRD (SUTURE) IMPLANT
SYR BULB IRRIG 60ML STRL (SYRINGE) ×1 IMPLANT
SYR CONTROL 10ML LL (SYRINGE) ×1 IMPLANT
TISSUE FLEXHD PERF PLIAB 6X16 (Tissue) IMPLANT
TOWEL GREEN STERILE FF (TOWEL DISPOSABLE) ×2 IMPLANT
TRAY DSU PREP LF (CUSTOM PROCEDURE TRAY) ×1 IMPLANT
TUBE CONNECTING 20X1/4 (TUBING) ×1 IMPLANT
UNDERPAD 30X36 HEAVY ABSORB (UNDERPADS AND DIAPERS) ×2 IMPLANT
YANKAUER SUCT BULB TIP NO VENT (SUCTIONS) ×1 IMPLANT

## 2021-12-12 NOTE — Anesthesia Procedure Notes (Signed)
Procedure Name: LMA Insertion Date/Time: 12/12/2021 11:29 AM  Performed by: Bufford Spikes, CRNAPre-anesthesia Checklist: Patient identified, Emergency Drugs available, Suction available and Patient being monitored Patient Re-evaluated:Patient Re-evaluated prior to induction Oxygen Delivery Method: Circle system utilized Preoxygenation: Pre-oxygenation with 100% oxygen Induction Type: IV induction Ventilation: Mask ventilation without difficulty LMA: LMA inserted LMA Size: 4.0 Number of attempts: 1 Airway Equipment and Method: Bite block Placement Confirmation: positive ETCO2 Tube secured with: Tape Dental Injury: Teeth and Oropharynx as per pre-operative assessment

## 2021-12-12 NOTE — Interval H&P Note (Signed)
History and Physical Interval Note:  12/12/2021 10:58 AM  Shelly Hensley  has presented today for surgery, with the diagnosis of Ductal carcinoma in situ of left breast.  The various methods of treatment have been discussed with the patient and family. After consideration of risks, benefits and other options for treatment, the patient has consented to  Procedure(s) with comments: replacement of right breast expander for reconstruction (Right) - Requesting 1 hour as a surgical intervention.  The patient's history has been reviewed, patient examined, no change in status, stable for surgery.  I have reviewed the patient's chart and labs.  Questions were answered to the patient's satisfaction.     Loel Lofty Dyanara Cozza

## 2021-12-12 NOTE — Anesthesia Preprocedure Evaluation (Signed)
Anesthesia Evaluation  Patient identified by MRN, date of birth, ID band Patient awake    Reviewed: Allergy & Precautions, NPO status , Patient's Chart, lab work & pertinent test results  History of Anesthesia Complications (+) PONV and history of anesthetic complications  Airway Mallampati: II  TM Distance: >3 FB Neck ROM: Full    Dental no notable dental hx. (+) Teeth Intact, Dental Advisory Given   Pulmonary neg pulmonary ROS,    Pulmonary exam normal breath sounds clear to auscultation       Cardiovascular negative cardio ROS Normal cardiovascular exam Rhythm:Regular Rate:Normal     Neuro/Psych negative neurological ROS  negative psych ROS   GI/Hepatic negative GI ROS, Neg liver ROS,   Endo/Other  Left breast Ca  Renal/GU negative Renal ROS  negative genitourinary   Musculoskeletal negative musculoskeletal ROS (+)   Abdominal   Peds  Hematology  (+) Blood dyscrasia, anemia ,   Anesthesia Other Findings   Reproductive/Obstetrics negative OB ROS                             Anesthesia Physical Anesthesia Plan  ASA: 2  Anesthesia Plan: General   Post-op Pain Management: Minimal or no pain anticipated and Tylenol PO (pre-op)*   Induction:   PONV Risk Score and Plan: 4 or greater and Treatment may vary due to age or medical condition, Midazolam, Ondansetron, Scopolamine patch - Pre-op and Dexamethasone  Airway Management Planned: LMA  Additional Equipment: None  Intra-op Plan:   Post-operative Plan: Extubation in OR  Informed Consent: I have reviewed the patients History and Physical, chart, labs and discussed the procedure including the risks, benefits and alternatives for the proposed anesthesia with the patient or authorized representative who has indicated his/her understanding and acceptance.     Dental advisory given  Plan Discussed with: CRNA and  Anesthesiologist  Anesthesia Plan Comments:         Anesthesia Quick Evaluation

## 2021-12-12 NOTE — Op Note (Signed)
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 female born on 1981/02/13, is here for treatment of acquired absence of right breast.   PROCEDURE DETAILS:  The patient was seen prior to surgery and marked.  The IV antibiotics were given. The patient was taken to the operating room and given a general anesthetic. A standard time out was performed and all information was confirmed by those in the room. SCDs were placed.   The chest was prepped and draped.  The local was placed in the right breast at the previous incision site.  The #15 blade was used to make the incision.  The bovie was used to dissect to the pectoralis muscle.  The muscle was located and lifted to create a space under the muscle.  The pocket was irrigated and the expander selected to be the same as the left side.  The expander was prepared according to the manufacture guidelines.  The Flex HD was prepared and placed in the pocket.  It was secured to the pectoralis muscle with the 2-0 PDS.  The expander was then placed and the air evacuated from the lumen.  The tabs were used to secure the expander in place.  The Flex HD was then secured to the chest wall with the 2-0 PDS.  The expander was filled with 100 cc of saline.  The drain was placed and secured to the chest wall with the 3-0 Silk.  The deep layers were closed with the 3-0 PDS followed by the 3-0 Monocryl..  the skin was closed with the 4-0 Monocryl.  Derma bond and steri strips were applied.   The patient was allowed to wake up and taken to  recovery room in stable condition at the end of the case. The family was notified at the end of the case.   The advanced practice practitioner (APP) assisted throughout the case.  The APP was essential in retraction and counter traction when needed to make the case progress smoothly.  This retraction and assistance made it possible to see the tissue plans for the procedure.  The assistance was needed for blood control, tissue re-approximation and assisted with closure of the incision site.

## 2021-12-12 NOTE — Transfer of Care (Signed)
Immediate Anesthesia Transfer of Care Note  Patient: Shelly Hensley  Procedure(s) Performed: replacement of right breast expander for reconstruction (Right: Breast)  Patient Location: PACU  Anesthesia Type:General  Level of Consciousness: awake, alert  and oriented  Airway & Oxygen Therapy: Patient Spontanous Breathing and Patient connected to face mask oxygen  Post-op Assessment: Report given to RN and Post -op Vital signs reviewed and stable  Post vital signs: Reviewed and stable  Last Vitals:  Vitals Value Taken Time  BP 120/73 12/12/21 1313  Temp    Pulse 76 12/12/21 1314  Resp 12 12/12/21 1314  SpO2 100 % 12/12/21 1314  Vitals shown include unvalidated device data.  Last Pain:  Vitals:   12/12/21 0943  TempSrc: Oral  PainSc: 0-No pain      Patients Stated Pain Goal: 3 (38/88/75 [id number])  Complications: No notable events documented.

## 2021-12-12 NOTE — Anesthesia Postprocedure Evaluation (Signed)
Anesthesia Post Note  Patient: Shelly Hensley  Procedure(s) Performed: replacement of right breast expander for reconstruction (Right: Breast)     Patient location during evaluation: PACU Anesthesia Type: General Level of consciousness: awake and alert and oriented Pain management: pain level controlled Vital Signs Assessment: post-procedure vital signs reviewed and stable Respiratory status: spontaneous breathing, nonlabored ventilation and respiratory function stable Cardiovascular status: blood pressure returned to baseline and stable Postop Assessment: no apparent nausea or vomiting Anesthetic complications: no   No notable events documented.  Last Vitals:  Vitals:   12/12/21 1350 12/12/21 1401  BP: 117/73 120/78  Pulse: 86 77  Resp: 17 16  Temp:  36.6 C  SpO2: 98% 99%    Last Pain:  Vitals:   12/12/21 1401  TempSrc: Oral  PainSc: 4                  Kipling Graser A.

## 2021-12-12 NOTE — Discharge Instructions (Addendum)
INSTRUCTIONS FOR AFTER BREAST SURGERY   You will likely have some questions about what to expect following your operation.  The following information will help you and your family understand what to expect when you are discharged from the hospital.  Following these guidelines will help ensure a smooth recovery and reduce risks of complications.  Postoperative instructions include information on: diet, wound care, medications and physical activity.  AFTER SURGERY Expect to go home after the procedure.  In some cases, you may need to spend one night in the hospital for observation.  DIET Breast surgery does not require a specific diet.  However, the healthier you eat the better your body can start healing. It is important to increasing your protein intake.  This means limiting the foods with sugar and carbohydrates.  Focus on vegetables and some meat.  If you have any liposuction during your procedure be sure to drink water.  If your urine is bright yellow, then it is concentrated, and you need to drink more water.  As a general rule after surgery, you should have 8 ounces of water every hour while awake.  If you find you are persistently nauseated or unable to take in liquids let us know.  NO TOBACCO USE or EXPOSURE.  This will slow your healing process and increase the risk of a wound.  WOUND CARE Leave the binder on for 3 days . Use fragrance free soap.   After 3 days you can remove the binder to shower. Once dry apply binder or sports bra.  Use a mild soap like Dial, Dove and [place]. You may have Topifoam or Lipofoam on.  It is soft and spongy and helps keep you from getting creases if you have liposuction.  This can be removed before the shower and then replaced.  If you need more it is available on [employer] (Lipofoam). If you have steri-strips / tape directly attached to your skin leave them in place. It is OK to get these wet.   No baths, pools or hot tubs for four weeks. We close your incision to  leave the smallest and best-looking scar. No ointment or creams on your incisions until given the go ahead.  Especially not Neosporin (Too many skin reactions with this one).  A few weeks after surgery you can use Mederma and start massaging the scar. We ask you to wear your binder or sports bra for the first 6 weeks around the clock, including while sleeping. This provides added comfort and helps reduce the fluid accumulation at the surgery site.  ACTIVITY No heavy lifting until cleared by the doctor.  This usually means no more than a half-gallon of milk.  It is OK to walk and climb stairs. In fact, moving your legs is very important to decrease your risk of a blood clot.  It will also help keep you from getting deconditioned.  Every 1 to 2 hours get up and walk for 5 minutes. This will help with a quicker recovery back to normal.  Let pain be your guide so you don't do too much.  This is not the time for spring cleaning and don't plan on taking care of anyone else.  This time is for you to recover,  You will be more comfortable if you sleep and rest with your head elevated either with a few pillows under you or in a recliner.  No stomach sleeping for a three months.  WORK Everyone returns to work at different times. As a  rough guide, most people take at least 1 - 2 weeks off prior to returning to work. If you need documentation for your job, bring the forms to your postoperative follow up visit.  DRIVING Arrange for someone to bring you home from the hospital.  You may be able to drive a few days after surgery but not while taking any narcotics or valium.  BOWEL MOVEMENTS Constipation can occur after anesthesia and while taking pain medication.  It is important to stay ahead for your comfort.  We recommend taking Milk of Magnesia (2 tablespoons; twice a day) while taking the pain pills.  MEDICATIONS You may be prescribed should start after surgery At your preoperative visit for you history and  physical you may have been given the following medications: An antibiotic: Start this medication when you get home and take according to the instructions on the bottle. Zofran 4 mg:  This is to treat nausea and vomiting.  You can take this every 6 hours as needed and only if needed. Valium 2 mg: This is for muscle tightness if you have an implant or expander. This will help relax your muscle which also helps with pain control.  This can be taken every 12 hours as needed. Don't drive after taking this medication. Norco (hydrocodone/acetaminophen) 5/325 mg:  This is only to be used after you have taken the motrin or the tylenol. Every 8 hours as needed.   Over the counter Medication to take: Ibuprofen (Motrin) 600 mg:  Take this every 6 hours.  If you have additional pain then take 500 mg of the tylenol every 8 hours.  Only take the Norco after you have tried these two. Miralax or stool softener of choice: Take this according to the bottle if you take the [place] Call your surgeon's office if any of the following occur: Fever 101 degrees F or greater Excessive bleeding or fluid from the incision site. Pain that increases over time without aid from the medications Redness, warmth, or pus draining from incision sites Persistent nausea or inability to take in liquids Severe misshapen area that underwent the operation.   Post Anesthesia Home Care Instructions  Activity: Get plenty of rest for the remainder of the day. A responsible individual must stay with you for 24 hours following the procedure.  For the next 24 hours, DO NOT: -Drive a car -Paediatric nurse -Drink alcoholic beverages -Take any medication unless instructed by your physician -Make any legal decisions or sign important papers.  Meals: Start with liquid foods such as gelatin or soup. Progress to regular foods as tolerated. Avoid greasy, spicy, heavy foods. If nausea and/or vomiting occur, drink only clear  liquids until the nausea and/or vomiting subsides. Call your physician if vomiting continues.  Special Instructions/Symptoms: Your throat may feel dry or sore from the anesthesia or the breathing tube placed in your throat during surgery. If this causes discomfort, gargle with warm salt water. The discomfort should disappear within 24 hours.  If you had a scopolamine patch placed behind your ear for the management of post- operative nausea and/or vomiting:  1. The medication in the patch is effective for 72 hours, after which it should be removed.  Wrap patch in a tissue and discard in the trash. Wash hands thoroughly with soap and water. 2. You may remove the patch earlier than 72 hours if you experience unpleasant side effects which may include dry mouth, dizziness or visual disturbances. 3. Avoid touching the patch.  Wash your hands with soap and water after contact with the patch.  About my Jackson-Pratt Bulb Drain  What is a Jackson-Pratt bulb? A Jackson-Pratt is a soft, round device used to collect drainage. It is connected to a long, thin drainage catheter, which is held in place by one or two small stiches near your surgical incision site. When the bulb is squeezed, it forms a vacuum, forcing the drainage to empty into the bulb.  Emptying the Jackson-Pratt bulb- To empty the bulb: 1. Release the plug on the top of the bulb. 2. Pour the bulb's contents into a measuring container which your nurse will provide. 3. Record the time emptied and amount of drainage. Empty the drain(s) as often as your     doctor or nurse recommends.  Date                  Time                    Amount (Drain 1)                 Amount (Drain  2)  _____________________________________________________________________  _____________________________________________________________________  _____________________________________________________________________  _____________________________________________________________________  _____________________________________________________________________  _____________________________________________________________________  _____________________________________________________________________  _____________________________________________________________________  Squeezing the Jackson-Pratt Bulb- To squeeze the bulb: 1. Make sure the plug at the top of the bulb is open. 2. Squeeze the bulb tightly in your fist. You will hear air squeezing from the bulb. 3. Replace the plug while the bulb is squeezed. 4. Use a safety pin to attach the bulb to your clothing. This will keep the catheter from     pulling at the bulb insertion site.  When to call your doctor- Call your doctor if: Drain site becomes red, swollen or hot. You have a fever greater than 101 degrees F. There is oozing at the drain site. Drain falls out (apply a guaze bandage over the drain hole and secure it with tape). Drainage increases daily not related to activity patterns. (You will usually have more drainage when you are active than when you are resting.) Drainage has a bad odor.        Next dose of Tylenol can be given at 4:15pm if needed. Oxycodone given at 2:00pm.

## 2021-12-13 ENCOUNTER — Encounter (HOSPITAL_BASED_OUTPATIENT_CLINIC_OR_DEPARTMENT_OTHER): Payer: Self-pay | Admitting: Plastic Surgery

## 2021-12-21 ENCOUNTER — Encounter: Payer: Self-pay | Admitting: Surgical

## 2021-12-21 ENCOUNTER — Ambulatory Visit (INDEPENDENT_AMBULATORY_CARE_PROVIDER_SITE_OTHER): Payer: Commercial Managed Care - PPO | Admitting: Surgical

## 2021-12-21 DIAGNOSIS — D0512 Intraductal carcinoma in situ of left breast: Secondary | ICD-10-CM

## 2021-12-21 MED ORDER — CLINDAMYCIN HCL 300 MG PO CAPS: 300.0000 mg | ORAL_CAPSULE | Freq: Three times a day (TID) | ORAL | 0 refills | Status: AC

## 2021-12-21 NOTE — Progress Notes (Signed)
Patient is a 41 year old female here for follow-up on her bilateral breast reconstruction.  She recently underwent replacement of right breast tissue expander with Dr. Marla Roe on 12/12/2021.  She underwent bilateral breast reconstruction placement tissue expanders and Flex HD on 10/02/2021 with Dr. Marla Roe, subsequently underwent removal of right breast tissue expander on 11/07/2021 for infection.  Cultures were positive for Staph aureus, sensitive to clindamycin  Patient presents today with her mother, reports overall she is doing well.  Reports drain output has been approximately 10 to 15 cc per 24 hours over the past 24 hours.  She is not having any infectious symptoms. She is very concerned about infection recurring and has some questions about antibiotics, her flu shot and having smaller implants placed to expedite her surgery.  Chaperone present on exam On exam left breast incision is intact, healing well, no erythema or cellulitic changes of the left breast.  No subcutaneous fluid collection noted with palpation.  On exam right breast dressings are in place.  No drainage is noted.  Right JP drain in place with approximately 5 cc of serosanguineous drainage noted.  There is no erythema of the right breast.  There is a little bit of subcutaneous fluid collection noted with palpation.  Honeycomb dressing was removed, Steri-Strips still in place.  She does have some mild irritation from the honeycomb clear tape.  Assessment/plan:  We placed injectable saline in the Expander using a sterile technique: Right: 50 cc for a total of 150 / 455 cc Left: 0 cc for a total of 300 / 455 cc  15 cc of dark serosanguineous fluid was aspirated from the right breast using a sterile technique after removing the needle from the expander port.  Patient tolerated this well.  Care was taken not to puncture the expander.  I discussed with the patient that I can fill out forms for her to hold off on her flu shot  for the next 4 weeks to allow her body to recover from anesthesia and the stress of her infection approximately 6 weeks ago. 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  Decision was made to leave the right JP drain in place given the placement of Flex HD and to avoid any fluid collections developing over the next week.  We will plan to remove it next week pending output.

## 2021-12-27 NOTE — Progress Notes (Cosign Needed Addendum)
Patient is a 41 year old female here for follow-up on her bilateral breast reconstruction.  She underwent replacement of right breast tissue expander with Dr. Marla Roe on 12/12/2021.  She is approximately 2 weeks postop.  She has a JP drain in place.  She reports output has been approximately 5 to 10 cc per 24 hours over the past week or so.  She reports overall she is feeling well.  She reports that she has been having a lot of stress related to the her reconstruction, particularly related to the right breast due to the history of infection and fear of additional complications.  She is here with her mother.  She reports that she has been using the skinuva scar cream in the left breast, but has noticed some irritation and has stopped using it.  Chaperone present on exam On exam left breast incision is intact, some mild maculopapular rash is noted in the peri-incisional area.  There is no cellulitic changes.  No tenderness noted with palpation.   On exam right breast Steri-Strips were placed, these were removed.  There was some mild drainage noted.  There is a small opening that is approximately 1 x 1.5 mm.  It is superficial.  There is no surrounding erythema or cellulitic change.  No active drainage noted with palpation.  No subcutaneous fluid collection noted to the right breast  Assessment/plan:  Patient would like to plan for exchange in December, which would be 3 months post placement of recent right breast tissue expander.  She does not have a specific size in mind, wants to be proportional.  We will plan to see her in 1 week for additional fill, she is then going to see Dr. Marla Roe in 3 weeks from now to discuss surgical planning.  We will send surgical route today to plan for December exchange.  We placed injectable saline in the Expander using a sterile technique: Right: 70 cc for a total of 220 / 455 cc Left: 0 cc for a total of 300 / 455 cc

## 2021-12-28 ENCOUNTER — Ambulatory Visit (INDEPENDENT_AMBULATORY_CARE_PROVIDER_SITE_OTHER): Payer: Commercial Managed Care - PPO | Admitting: Surgical

## 2021-12-28 DIAGNOSIS — D0512 Intraductal carcinoma in situ of left breast: Secondary | ICD-10-CM

## 2022-01-04 ENCOUNTER — Ambulatory Visit (INDEPENDENT_AMBULATORY_CARE_PROVIDER_SITE_OTHER): Payer: Commercial Managed Care - PPO | Admitting: Surgical

## 2022-01-04 DIAGNOSIS — D0512 Intraductal carcinoma in situ of left breast: Secondary | ICD-10-CM

## 2022-01-04 NOTE — Progress Notes (Signed)
Patient is a 41 year old female here for follow-up on her bilateral breast reconstruction.  She most recently underwent replacement of the right breast tissue expander with Dr. Marla Roe on 12/12/2021.  She reports she is doing well after her last expansion.  She has some questions about returning to work, Radio broadcast assistant for exchange.  She is here with her mother.  Chaperone present on exam On exam bilateral breast incisions are intact, healing well.  The area that had a small opening at her last appointment has completely healed.  There is no erythema or cellulitic changes noted.  Overall bilateral breasts appear symmetric, she does have some more volume in the left breast at this time.   A/P:  We placed injectable saline in the Expander using a sterile technique: Right: 50 cc for a total of 270 / 455 cc Left: 0 cc for a total of 300 / 455 cc  Recommend following up in 2 weeks with Dr. Marla Roe to discuss surgical planning.  Previously sent route to plan for exchange in December.  We discussed size today, she would like to be about a C cup.  I think she is getting close to this, may only need 1-2 more fills.    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  We discussed that if she does feel as if she has any deficits in strength or issues fulfilling her job duties that we can write her out longer.  She has a few more weeks to continue to improve strength and conditioning prior to returning to work.

## 2022-01-15 ENCOUNTER — Encounter: Payer: Self-pay | Admitting: Plastic Surgery

## 2022-01-15 ENCOUNTER — Ambulatory Visit (INDEPENDENT_AMBULATORY_CARE_PROVIDER_SITE_OTHER): Payer: Commercial Managed Care - PPO | Admitting: Plastic Surgery

## 2022-01-15 DIAGNOSIS — D0512 Intraductal carcinoma in situ of left breast: Secondary | ICD-10-CM

## 2022-01-15 NOTE — Progress Notes (Signed)
   Subjective:    Patient ID: Shelly Hensley, female    DOB: [birth year], 41 y.o.   MRN: [id number]  HPI The patient is a 41 year old female here for follow-up on her breast reconstruction.  She was Diagnosed with breast cancer and underwent bilateral breast reconstruction in July 2023.  She had expanders and Flex HD placed.  In August she presented with infection of the right breast.  The decision was made to remove the expander and give it time to resolve.  She has done really well since then and was able to have the expander placed in September 2023.  She now has 270 cc on the right and 300 cc on the left breast.  There is no sign of infection.  The right breast is a little tighter than the left breast  Review of Systems  Constitutional: Negative.   Eyes: Negative.   Respiratory: Negative.    Cardiovascular: Negative.   Gastrointestinal: Negative.   Endocrine: Negative.   Genitourinary: Negative.        Objective:   Physical Exam Constitutional:      Appearance: Normal appearance.  Cardiovascular:     Rate and Rhythm: Normal rate.     Pulses: Normal pulses.  Pulmonary:     Effort: Pulmonary effort is normal.  Neurological:     Mental Status: She is alert and oriented to person, place, and time.  Psychiatric:        Mood and Affect: Mood normal.        Behavior: Behavior normal.        Thought Content: Thought content normal.        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  I will go ahead and request for time for exchange of bilateral expanders for placement of implants.

## 2022-01-23 ENCOUNTER — Telehealth: Payer: Self-pay

## 2022-01-23 NOTE — Telephone Encounter (Signed)
CPT code [id number] uploaded [id number]

## 2022-01-29 ENCOUNTER — Ambulatory Visit (INDEPENDENT_AMBULATORY_CARE_PROVIDER_SITE_OTHER): Payer: Commercial Managed Care - PPO | Admitting: Surgical

## 2022-01-29 ENCOUNTER — Encounter: Payer: Self-pay | Admitting: Surgical

## 2022-01-29 DIAGNOSIS — Z1589 Genetic susceptibility to other disease: Secondary | ICD-10-CM

## 2022-01-29 DIAGNOSIS — Z1509 Genetic susceptibility to other malignant neoplasm: Secondary | ICD-10-CM

## 2022-01-29 DIAGNOSIS — Z1501 Genetic susceptibility to malignant neoplasm of breast: Secondary | ICD-10-CM

## 2022-01-29 DIAGNOSIS — Z1379 Encounter for other screening for genetic and chromosomal anomalies: Secondary | ICD-10-CM

## 2022-01-29 DIAGNOSIS — D0512 Intraductal carcinoma in situ of left breast: Secondary | ICD-10-CM

## 2022-01-29 DIAGNOSIS — Z1502 Genetic susceptibility to malignant neoplasm of ovary: Secondary | ICD-10-CM

## 2022-01-29 NOTE — Progress Notes (Signed)
Patient is a 41 year old female here for follow-up on her bilateral breast reconstruction.  She is ready for exchange of bilateral breast tissue expanders for bilateral breast silicone implants.  She is hopeful to have this done this month or December.  She saw Dr. Marla Roe on 01/15/2022 and discussed 1 additional fill.  Patient reports she is doing well, she is not having any infectious symptoms.  She has returned to work and reports this is going well.  She is ready for exchange of bilateral breast tissue expanders to implants after expansion today.  Chaperone present on Exam On exam bilateral breast incisions are intact, healing well.  There is no erythema or cellulitic changes noted.  Bilateral breasts are symmetric, no subcutaneous fluid collection noted with palpation.   A/P:  Plan for surgical intervention late November or early December pending surgical availability.  Route has previously been placed for surgical scheduling.  Will discuss with surgical scheduling team today to notify them patient is ready for exchange.  Patient provided with Mentor implant checklist, general surgery consent forms today.  Recommend reading at home and bringing to her preop appointment.  Pictures were obtained of the patient and placed in the chart with the patient's or guardian's permission.  We placed injectable saline in the Expander using a sterile technique: Right: 50 cc for a total of 350 / 455 cc Left: 50 cc for a total of 350 / 455 cc

## 2022-03-05 ENCOUNTER — Ambulatory Visit (INDEPENDENT_AMBULATORY_CARE_PROVIDER_SITE_OTHER): Payer: Commercial Managed Care - PPO | Admitting: Surgical

## 2022-03-05 ENCOUNTER — Encounter: Payer: Self-pay | Admitting: Surgical

## 2022-03-05 ENCOUNTER — Encounter: Payer: Self-pay | Admitting: Plastic Surgery

## 2022-03-05 VITALS — BP 157/95 | HR 65 | Temp 98.5°F | Resp 18

## 2022-03-05 DIAGNOSIS — Z1589 Genetic susceptibility to other disease: Secondary | ICD-10-CM

## 2022-03-05 DIAGNOSIS — Z1501 Genetic susceptibility to malignant neoplasm of breast: Secondary | ICD-10-CM

## 2022-03-05 DIAGNOSIS — Z1509 Genetic susceptibility to other malignant neoplasm: Secondary | ICD-10-CM

## 2022-03-05 DIAGNOSIS — D0512 Intraductal carcinoma in situ of left breast: Secondary | ICD-10-CM

## 2022-03-05 DIAGNOSIS — Z1502 Genetic susceptibility to malignant neoplasm of ovary: Secondary | ICD-10-CM

## 2022-03-05 DIAGNOSIS — Z1379 Encounter for other screening for genetic and chromosomal anomalies: Secondary | ICD-10-CM

## 2022-03-05 MED ORDER — OXYCODONE HCL 5 MG PO TABS: 5.0000 mg | ORAL_TABLET | Freq: Four times a day (QID) | ORAL | 0 refills | Status: AC | PRN

## 2022-03-05 MED ORDER — FLUCONAZOLE 150 MG PO TABS: 150.0000 mg | ORAL_TABLET | Freq: Once | ORAL | 0 refills | Status: AC

## 2022-03-05 MED ORDER — ONDANSETRON HCL 4 MG PO TABS: 4.0000 mg | ORAL_TABLET | Freq: Three times a day (TID) | ORAL | 0 refills | Status: DC | PRN

## 2022-03-05 MED ORDER — CLINDAMYCIN HCL 300 MG PO CAPS: 300.0000 mg | ORAL_CAPSULE | Freq: Three times a day (TID) | ORAL | 0 refills | Status: AC

## 2022-03-05 NOTE — H&P (View-Only) (Signed)
Patient ID: [name], female    DOB: [birth year], 41 y.o.   MRN: [id number]  Chief Complaint  Patient presents with   Pre-op Exam      ICD-10-CM   1. Ductal carcinoma in situ (DCIS) of left breast  D05.12     2. Genetic testing  Z13.79     3. Monoallelic mutation of CHEK2 gene in female patient  Z15.01    Z15.89    Z15.09    Z15.02       History of Present Illness: NIKESHIA KEETCH is a 41 y.o.  female  with a history of bilateral and bilateral breast reconstruction.  She presents for preoperative evaluation for upcoming procedure, bilateral breast tissue expanders and placement of bilateral silicone breast implants, scheduled for 03/21/2022 with Dr.  Marla Roe  Patient has a history of postoperative nausea and vomiting with anesthesia, reports scopolamine patch and previous "cocktails" have worked well.  She has not had issues with the most recent surgery.   No history of DVT/PE.  Father with history of DVT while hospitalized in the ICU for severe illness.  No other family history of DVT or PE.  No family or personal history of bleeding or clotting disorders.  Patient is not currently taking any blood thinners.  No history of CVA/MI.   Summary of Previous Visit: Patient currently has 350 out of 455 cc in each expander. Of note she did undergo removal of right breast tissue expander for infection on 11/07/2021.  She subsequently had the expander replaced on 12/12/2021.  Culture was staph RES sensitive to clindamycin.  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  Patient reports she is feeling well lately.  She is here with her mother.  She has some questions about silicone versus saline, she has elected for silicone implants.  She has some questions about expected size.  Past Medical History: Allergies: Allergies  Allergen Reactions   Other Anaphylaxis and Other (See  Comments)    Tree nuts    Current Medications:  Current Outpatient Medications:    acetaminophen (TYLENOL) 500 MG tablet, Take 500-1,000 mg by mouth 2 (two) times daily as needed for mild pain., Disp: , Rfl:    ALOE VERA PO, Take 1 capsule by mouth daily., Disp: , Rfl:    Cholecalciferol (VITAMIN D-3 PO), Take 1 capsule by mouth daily., Disp: , Rfl:    ibuprofen (ADVIL) 200 MG tablet, Take 600 mg by mouth 2 (two) times daily as needed for headache or mild pain., Disp: , Rfl:    loratadine (CLARITIN) 10 MG tablet, Take 10 mg by mouth daily., Disp: , Rfl:    OVER THE COUNTER MEDICATION, Take 1 capsule by mouth daily. Juice Plus, Disp: , Rfl:    diazepam (VALIUM) 2 MG tablet, Take 1 tablet (2 mg total) by mouth every 12 (twelve) hours as needed for muscle spasms. (Patient not taking: Reported on 03/05/2022), Disp: 20 tablet, Rfl: 0   fluconazole (DIFLUCAN) 150 MG tablet, Take 1 tablet (150 mg total) by mouth daily. (Patient not taking: Reported on 03/05/2022), Disp: 1 tablet, Rfl: 0   ondansetron (ZOFRAN) 4 MG tablet, Take 1 tablet (4 mg total) by mouth every 8 (eight) hours as needed for nausea or vomiting. (Patient not taking: Reported on 03/05/2022), Disp: 20 tablet, Rfl: 0  Past Medical Problems: Past Medical History:  Diagnosis Date   Ductal carcinoma  in situ (DCIS) of left breast 07/16/2021   Family history of colon cancer 07/16/2021   Family history of ovarian cancer 07/16/2021   Genetic testing 07/23/2021   CHEK2 mutation detected at c.1100delC [phone]).  No other pathogenic variants detected in [facility] Panel.  Report date is Jul 23, 2021.   The BRCAplus panel offered by [employer] and includes sequencing and deletion/duplication analysis for the following 8 genes: ATM, BRCA1, BRCA2, CDH1, CHEK2, PALB2, PTEN, and TP53.  Results of pan-cancer panel are pending.    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   PONV (postoperative nausea and vomiting)     Seroma of breast    admitted with infection 11/05/21-11/09/21    Past Surgical History: Past Surgical History:  Procedure Laterality Date   BREAST RECONSTRUCTION WITH PLACEMENT OF TISSUE EXPANDER AND FLEX HD (ACELLULAR HYDRATED DERMIS) Bilateral 10/02/2021   Procedure: BREAST RECONSTRUCTION WITH PLACEMENT OF TISSUE EXPANDER AND FLEX HD (ACELLULAR HYDRATED DERMIS);  Surgeon: Wallace Going, DO;  Location: [place];  Service: Plastics;  Laterality: Bilateral;   BREAST RECONSTRUCTION WITH PLACEMENT OF TISSUE EXPANDER AND FLEX HD (ACELLULAR HYDRATED DERMIS) Right 12/12/2021   Procedure: replacement of right breast expander for reconstruction;  Surgeon: Wallace Going, DO;  Location: [place];  Service: Plastics;  Laterality: Right;  Requesting 1 hour   REMOVAL OF TISSUE EXPANDER Right 11/07/2021   Procedure: REMOVAL OF RIGHT TISSUE EXPANDER;  Surgeon: Wallace Going, DO;  Location: [place];  Service: Plastics;  Laterality: Right;   SIMPLE MASTECTOMY WITH AXILLARY SENTINEL NODE BIOPSY Bilateral 10/02/2021   Procedure: SIMPLE MASTECTOMY;  Surgeon: Pollyann Samples, MD;  Location: [place];  Service: General;  Laterality: Bilateral;    Social History: Social History   Socioeconomic History   Marital status: Married    Spouse name: Brandin   Number of children: 1   Years of education: 12+2   Highest education level: Associate degree: academic program  Occupational History   Not on file  Tobacco Use   Smoking status: Never   Smokeless tobacco: Never  Vaping Use   Vaping Use: Never used  Substance and Sexual Activity   Alcohol use: Never   Drug use: Never   Sexual activity: Yes  Other Topics Concern   Not on file  Social History Narrative   Not on file   Social Determinants of Health   Financial Resource Strain: Not on file  Food Insecurity: Not on file  Transportation Needs: Not on file  Physical Activity: Not on  file  Stress: Not on file  Social Connections: Not on file  Intimate Partner Violence: Not on file    Family History: Family History  Problem Relation Age of Onset   Skin cancer Father        face; surgery only   Colon cancer Maternal Aunt        dx before 28?   Cancer Maternal Uncle        larynx; dx after 42   Lung cancer Paternal Aunt        dx after 68   Lung cancer Paternal Uncle        dx after 48   Colon cancer Cousin        dx 42s; paternal female cousin   Ovarian cancer Cousin        dx 73s; paternal cousin    Review of Systems: Review of Systems  Constitutional: Negative.  Respiratory: Negative.    Cardiovascular: Negative.   Gastrointestinal: Negative.   Neurological: Negative.     Physical Exam: Vital Signs BP (!) 157/95 (BP Location: Right Arm, Patient Position: Sitting, Cuff Size: Normal)   Pulse 65   Temp 98.5 F (36.9 C) (Oral)   Resp 18   SpO2 100%   Physical Exam  Constitutional:      General: Not in acute distress.    Appearance: Normal appearance. Not ill-appearing.  HENT:     Head: Normocephalic and atraumatic.  Eyes:     Pupils: Pupils are equal, round Neck:     Musculoskeletal: Normal range of motion.  Cardiovascular:     Rate and Rhythm: Normal rate    Pulses: Normal pulses.  Pulmonary:     Effort: Pulmonary effort is normal. No respiratory distress.  Musculoskeletal: Normal range of motion.  Skin:    General: Skin is warm and dry.     Findings: No erythema or rash.  Neurological:     General: No focal deficit present.     Mental Status: Alert and oriented to person, place, and time. Mental status is at baseline.     Motor: No weakness.  Psychiatric:        Mood and Affect: Mood normal.        Behavior: Behavior normal.    Assessment/Plan: The patient is scheduled for exchange of bilateral breast tissue expanders for bilateral breast silicone implants with Dr. Marla Roe.  Risks, benefits, and alternatives of procedure  discussed, questions answered and consent obtained.    Smoking Status: Non-smoker; Counseling Given?  N/A Last Mammogram: Status post mastectomy bilaterally; Results: N/A  Caprini Score: 8, high risk; Risk Factors include: Age, BMI > 25, history of cancer, father with history of DVT and length of planned surgery. Recommendation for mechanical and possible pharmacological prophylaxis. Encourage early ambulation.  Given father's history of DVT was provoked by significant illness while in the ICU, will plan for early ambulation and SCDs only.  Pictures obtained:01/29/2022  Post-op Rx sent to pharmacy:  Clindamycin, Zofran, oxycodone  Patient was provided with the breast reconstruction and General Surgical Risk consent document and Pain Medication Agreement prior to their appointment.  They had adequate time to read through the risk consent documents and Pain Medication Agreement. We also discussed them in person together during this preop appointment. All of their questions were answered to their satisfaction.  Recommended calling if they have any further questions.  Risk consent form and Pain Medication Agreement to be scanned into patient's chart.  The risks that can be encountered with and after placement of a breast implant were discussed and include the following but not limited to these: bleeding, infection, delayed healing, anesthesia risks, skin sensation changes, injury to structures including nerves, blood vessels, and muscles which may be temporary or permanent, allergies to tape, suture materials and glues, blood products, topical preparations or injected agents, skin contour irregularities, skin discoloration and swelling, deep vein thrombosis, cardiac and pulmonary complications, pain, which may persist, fluid accumulation, wrinkling of the skin over the implanmt, changes in nipple or breast sensation, implant leakage or rupture, faulty position of the implant, persistent pain, formation of  tight scar tissue around the implant (capsular contracture).  Patient was provided with the [facility] implant patient decision checklist and this was completed during today's preoperative evaluation. Patient had time to read through the information and any questions were answered to their content. Form will be scanned into patient's chart.  We  discussed expected postoperative breast size would be similar to her current size, we discussed that the implant volume would be higher than the current expander due to the port in the expander.  Patient was understanding of this   Electronically signed by: Carola Rhine Pia Jedlicka, PA-C 03/05/2022 3:12 PM

## 2022-03-05 NOTE — Progress Notes (Signed)
Patient ID: Shelly Hensley, female    DOB: [birth year], 41 y.o.   MRN: [id number]  Chief Complaint  Patient presents with   Pre-op Exam      ICD-10-CM   1. Ductal carcinoma in situ (DCIS) of left breast  D05.12     2. Genetic testing  Z13.79     3. Monoallelic mutation of CHEK2 gene in female patient  Z15.01    Z15.89    Z15.09    Z15.02       History of Present Illness: Shelly Hensley is a 41 y.o.  female  with a history of bilateral and bilateral breast reconstruction.  She presents for preoperative evaluation for upcoming procedure, bilateral breast tissue expanders and placement of bilateral silicone breast implants, scheduled for 03/21/2022 with Dr.  Marla Roe  Patient has a history of postoperative nausea and vomiting with anesthesia, reports scopolamine patch and previous "cocktails" have worked well.  She has not had issues with the most recent surgery.   No history of DVT/PE.  Father with history of DVT while hospitalized in the ICU for severe illness.  No other family history of DVT or PE.  No family or personal history of bleeding or clotting disorders.  Patient is not currently taking any blood thinners.  No history of CVA/MI.   Summary of Previous Visit: Patient currently has 350 out of 455 cc in each expander. Of note she did undergo removal of right breast tissue expander for infection on 11/07/2021.  She subsequently had the expander replaced on 12/12/2021.  Culture was staph RES sensitive to clindamycin.  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  Patient reports she is feeling well lately.  She is here with her mother.  She has some questions about silicone versus saline, she has elected for silicone implants.  She has some questions about expected size.  Past Medical History: Allergies: Allergies  Allergen Reactions   Other Anaphylaxis and Other (See  Comments)    Tree nuts    Current Medications:  Current Outpatient Medications:    acetaminophen (TYLENOL) 500 MG tablet, Take 500-1,000 mg by mouth 2 (two) times daily as needed for mild pain., Disp: , Rfl:    ALOE VERA PO, Take 1 capsule by mouth daily., Disp: , Rfl:    Cholecalciferol (VITAMIN D-3 PO), Take 1 capsule by mouth daily., Disp: , Rfl:    ibuprofen (ADVIL) 200 MG tablet, Take 600 mg by mouth 2 (two) times daily as needed for headache or mild pain., Disp: , Rfl:    loratadine (CLARITIN) 10 MG tablet, Take 10 mg by mouth daily., Disp: , Rfl:    OVER THE COUNTER MEDICATION, Take 1 capsule by mouth daily. Juice Plus, Disp: , Rfl:    diazepam (VALIUM) 2 MG tablet, Take 1 tablet (2 mg total) by mouth every 12 (twelve) hours as needed for muscle spasms. (Patient not taking: Reported on 03/05/2022), Disp: 20 tablet, Rfl: 0   fluconazole (DIFLUCAN) 150 MG tablet, Take 1 tablet (150 mg total) by mouth daily. (Patient not taking: Reported on 03/05/2022), Disp: 1 tablet, Rfl: 0   ondansetron (ZOFRAN) 4 MG tablet, Take 1 tablet (4 mg total) by mouth every 8 (eight) hours as needed for nausea or vomiting. (Patient not taking: Reported on 03/05/2022), Disp: 20 tablet, Rfl: 0  Past Medical Problems: Past Medical History:  Diagnosis Date   Ductal carcinoma  in situ (DCIS) of left breast 07/16/2021   Family history of colon cancer 07/16/2021   Family history of ovarian cancer 07/16/2021   Genetic testing 07/23/2021   CHEK2 mutation detected at c.1100delC (p.[id number]*15).  No other pathogenic variants detected in [facility] Panel.  Report date is Jul 23, 2021.   The BRCAplus panel offered by [facility] and includes sequencing and deletion/duplication analysis for the following 8 genes: ATM, BRCA1, BRCA2, CDH1, CHEK2, PALB2, PTEN, and TP53.  Results of pan-cancer panel are pending.    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   PONV (postoperative nausea and vomiting)     Seroma of breast    admitted with infection 11/05/21-11/09/21    Past Surgical History: Past Surgical History:  Procedure Laterality Date   BREAST RECONSTRUCTION WITH PLACEMENT OF TISSUE EXPANDER AND FLEX HD (ACELLULAR HYDRATED DERMIS) Bilateral 10/02/2021   Procedure: BREAST RECONSTRUCTION WITH PLACEMENT OF TISSUE EXPANDER AND FLEX HD (ACELLULAR HYDRATED DERMIS);  Surgeon: Dillingham, Claire S, DO;  Location: [facility];  Service: Plastics;  Laterality: Bilateral;   BREAST RECONSTRUCTION WITH PLACEMENT OF TISSUE EXPANDER AND FLEX HD (ACELLULAR HYDRATED DERMIS) Right 12/12/2021   Procedure: replacement of right breast expander for reconstruction;  Surgeon: Dillingham, Claire S, DO;  Location: [facility];  Service: Plastics;  Laterality: Right;  Requesting 1 hour   REMOVAL OF TISSUE EXPANDER Right 11/07/2021   Procedure: REMOVAL OF RIGHT TISSUE EXPANDER;  Surgeon: Dillingham, Claire S, DO;  Location: [facility];  Service: Plastics;  Laterality: Right;   SIMPLE MASTECTOMY WITH AXILLARY SENTINEL NODE BIOPSY Bilateral 10/02/2021   Procedure: SIMPLE MASTECTOMY;  Surgeon: Morgan, James H, MD;  Location: [facility];  Service: General;  Laterality: Bilateral;    Social History: Social History   Socioeconomic History   Marital status: Married    Spouse name: Brandin   Number of children: 1   Years of education: 12+2   Highest education level: Associate degree: academic program  Occupational History   Not on file  Tobacco Use   Smoking status: Never   Smokeless tobacco: Never  Vaping Use   Vaping Use: Never used  Substance and Sexual Activity   Alcohol use: Never   Drug use: Never   Sexual activity: Yes  Other Topics Concern   Not on file  Social History Narrative   Not on file   Social Determinants of Health   Financial Resource Strain: Not on file  Food Insecurity: Not on file  Transportation Needs: Not on file  Physical Activity: Not on  file  Stress: Not on file  Social Connections: Not on file  Intimate Partner Violence: Not on file    Family History: Family History  Problem Relation Age of Onset   Skin cancer Father        face; surgery only   Colon cancer Maternal Aunt        dx before 60?   Cancer Maternal Uncle        larynx; dx after 50   Lung cancer Paternal Aunt        dx after 50   Lung cancer Paternal Uncle        dx after 50   Colon cancer Cousin        dx 40s; paternal female cousin   Ovarian cancer Cousin        dx 40s; paternal cousin    Review of Systems: Review of Systems  Constitutional: Negative.     Respiratory: Negative.    Cardiovascular: Negative.   Gastrointestinal: Negative.   Neurological: Negative.     Physical Exam: Vital Signs BP (!) 157/95 (BP Location: Right Arm, Patient Position: Sitting, Cuff Size: Normal)   Pulse 65   Temp 98.5 F (36.9 C) (Oral)   Resp 18   SpO2 100%   Physical Exam  Constitutional:      General: Not in acute distress.    Appearance: Normal appearance. Not ill-appearing.  HENT:     Head: Normocephalic and atraumatic.  Eyes:     Pupils: Pupils are equal, round Neck:     Musculoskeletal: Normal range of motion.  Cardiovascular:     Rate and Rhythm: Normal rate    Pulses: Normal pulses.  Pulmonary:     Effort: Pulmonary effort is normal. No respiratory distress.  Musculoskeletal: Normal range of motion.  Skin:    General: Skin is warm and dry.     Findings: No erythema or rash.  Neurological:     General: No focal deficit present.     Mental Status: Alert and oriented to person, place, and time. Mental status is at baseline.     Motor: No weakness.  Psychiatric:        Mood and Affect: Mood normal.        Behavior: Behavior normal.    Assessment/Plan: The patient is scheduled for exchange of bilateral breast tissue expanders for bilateral breast silicone implants with Dr. Dillingham.  Risks, benefits, and alternatives of procedure  discussed, questions answered and consent obtained.    Smoking Status: Non-smoker; Counseling Given?  N/A Last Mammogram: Status post mastectomy bilaterally; Results: N/A  Caprini Score: 8, high risk; Risk Factors include: Age, BMI > 25, history of cancer, father with history of DVT and length of planned surgery. Recommendation for mechanical and possible pharmacological prophylaxis. Encourage early ambulation.  Given father's history of DVT was provoked by significant illness while in the ICU, will plan for early ambulation and SCDs only.  Pictures obtained:01/29/2022  Post-op Rx sent to pharmacy:  Clindamycin, Zofran, oxycodone  Patient was provided with the breast reconstruction and General Surgical Risk consent document and Pain Medication Agreement prior to their appointment.  They had adequate time to read through the risk consent documents and Pain Medication Agreement. We also discussed them in person together during this preop appointment. All of their questions were answered to their satisfaction.  Recommended calling if they have any further questions.  Risk consent form and Pain Medication Agreement to be scanned into patient's chart.  The risks that can be encountered with and after placement of a breast implant were discussed and include the following but not limited to these: bleeding, infection, delayed healing, anesthesia risks, skin sensation changes, injury to structures including nerves, blood vessels, and muscles which may be temporary or permanent, allergies to tape, suture materials and glues, blood products, topical preparations or injected agents, skin contour irregularities, skin discoloration and swelling, deep vein thrombosis, cardiac and pulmonary complications, pain, which may persist, fluid accumulation, wrinkling of the skin over the implanmt, changes in nipple or breast sensation, implant leakage or rupture, faulty position of the implant, persistent pain, formation of  tight scar tissue around the implant (capsular contracture).  Patient was provided with the [facility] implant patient decision checklist and this was completed during today's preoperative evaluation. Patient had time to read through the information and any questions were answered to their content. Form will be scanned into patient's chart.  We   discussed expected postoperative breast size would be similar to her current size, we discussed that the implant volume would be higher than the current expander due to the port in the expander.  Patient was understanding of this   Electronically signed by: Carola Rhine Kc Sedlak, PA-C 03/05/2022 3:12 PM

## 2022-03-12 ENCOUNTER — Other Ambulatory Visit: Payer: Self-pay

## 2022-03-12 ENCOUNTER — Encounter (HOSPITAL_BASED_OUTPATIENT_CLINIC_OR_DEPARTMENT_OTHER): Payer: Self-pay | Admitting: Plastic Surgery

## 2022-03-18 NOTE — Anesthesia Preprocedure Evaluation (Addendum)
Anesthesia Evaluation  Patient identified by MRN, date of birth, ID band Patient awake    Reviewed: Allergy & Precautions, NPO status , Patient's Chart, lab work & pertinent test results  History of Anesthesia Complications (+) PONV and history of anesthetic complications  Airway Mallampati: II  TM Distance: >3 FB Neck ROM: Full    Dental  (+) Teeth Intact, Dental Advisory Given   Pulmonary neg pulmonary ROS   Pulmonary exam normal breath sounds clear to auscultation       Cardiovascular negative cardio ROS  Rhythm:Regular Rate:Normal     Neuro/Psych negative neurological ROS     GI/Hepatic negative GI ROS, Neg liver ROS,,,  Endo/Other  negative endocrine ROS    Renal/GU negative Renal ROS     Musculoskeletal   Abdominal  (+) - obese  Peds  Hematology  (+) Blood dyscrasia, anemia   Anesthesia Other Findings DCIS of left breast  Reproductive/Obstetrics                             Anesthesia Physical Anesthesia Plan  ASA: 2  Anesthesia Plan: General   Post-op Pain Management: Tylenol PO (pre-op)*   Induction:   PONV Risk Score and Plan: 4 or greater and Ondansetron, Dexamethasone, Propofol infusion, Scopolamine patch - Pre-op, Midazolam, Treatment may vary due to age or medical condition and TIVA  Airway Management Planned: Oral ETT  Additional Equipment:   Intra-op Plan:   Post-operative Plan: Extubation in OR  Informed Consent: I have reviewed the patients History and Physical, chart, labs and discussed the procedure including the risks, benefits and alternatives for the proposed anesthesia with the patient or authorized representative who has indicated his/her understanding and acceptance.     Dental advisory given  Plan Discussed with: CRNA and Anesthesiologist  Anesthesia Plan Comments: (Risks of general anesthesia discussed including, but not limited to, sore  throat, hoarse voice, chipped/damaged teeth, injury to vocal cords, nausea and vomiting, allergic reactions, lung infection, heart attack, stroke, and death. All questions answered. )        Anesthesia Quick Evaluation

## 2022-03-21 ENCOUNTER — Ambulatory Visit (HOSPITAL_BASED_OUTPATIENT_CLINIC_OR_DEPARTMENT_OTHER): Payer: Commercial Managed Care - PPO | Admitting: Anesthesiology

## 2022-03-21 ENCOUNTER — Ambulatory Visit (HOSPITAL_BASED_OUTPATIENT_CLINIC_OR_DEPARTMENT_OTHER)
Admission: RE | Admit: 2022-03-21 | Discharge: 2022-03-21 | Disposition: A | Discharge status: Home / Self Care | Payer: Commercial Managed Care - PPO | Source: Ambulatory Visit | Attending: Plastic Surgery | Admitting: Plastic Surgery

## 2022-03-21 ENCOUNTER — Encounter (HOSPITAL_BASED_OUTPATIENT_CLINIC_OR_DEPARTMENT_OTHER): Payer: Self-pay | Admitting: Plastic Surgery

## 2022-03-21 ENCOUNTER — Other Ambulatory Visit: Payer: Self-pay

## 2022-03-21 ENCOUNTER — Encounter (HOSPITAL_BASED_OUTPATIENT_CLINIC_OR_DEPARTMENT_OTHER)
Admission: RE | Disposition: A | Discharge status: Home / Self Care | Payer: Self-pay | Source: Ambulatory Visit | Attending: Plastic Surgery

## 2022-03-21 DIAGNOSIS — Z45811 Encounter for adjustment or removal of right breast implant: Secondary | ICD-10-CM | POA: Diagnosis not present

## 2022-03-21 DIAGNOSIS — Z1501 Genetic susceptibility to malignant neoplasm of breast: Secondary | ICD-10-CM | POA: Diagnosis not present

## 2022-03-21 DIAGNOSIS — Z45812 Encounter for adjustment or removal of left breast implant: Secondary | ICD-10-CM | POA: Diagnosis not present

## 2022-03-21 DIAGNOSIS — Z9013 Acquired absence of bilateral breasts and nipples: Secondary | ICD-10-CM

## 2022-03-21 DIAGNOSIS — Z853 Personal history of malignant neoplasm of breast: Secondary | ICD-10-CM | POA: Diagnosis not present

## 2022-03-21 DIAGNOSIS — Z01818 Encounter for other preprocedural examination: Secondary | ICD-10-CM

## 2022-03-21 HISTORY — PX: REMOVAL OF TISSUE EXPANDER AND PLACEMENT OF IMPLANT: SHX6457

## 2022-03-21 LAB — POCT PREGNANCY, URINE: Preg Test, Ur: NEGATIVE

## 2022-03-21 SURGERY — REMOVAL, TISSUE EXPANDER, BREAST, WITH IMPLANT INSERTION
Anesthesia: General | Site: Breast | Laterality: Bilateral

## 2022-03-21 MED ORDER — SCOPOLAMINE 1 MG/3DAYS TD PT72
MEDICATED_PATCH | TRANSDERMAL | Status: AC
  Filled 2022-03-21: qty 1

## 2022-03-21 MED ORDER — PROPOFOL 500 MG/50ML IV EMUL
INTRAVENOUS | Status: DC | PRN
  Administered 2022-03-21 (×2): 150 ug/kg/min via INTRAVENOUS

## 2022-03-21 MED ORDER — SCOPOLAMINE 1 MG/3DAYS TD PT72
1.0000 | MEDICATED_PATCH | TRANSDERMAL | Status: DC
  Administered 2022-03-21: 1.5 mg via TRANSDERMAL

## 2022-03-21 MED ORDER — SODIUM CHLORIDE 0.9 % IV SOLN: 250.0000 mL | INTRAVENOUS | Status: DC | PRN

## 2022-03-21 MED ORDER — LACTATED RINGERS IV SOLN: INTRAVENOUS | Status: DC

## 2022-03-21 MED ORDER — FENTANYL CITRATE (PF) 100 MCG/2ML IJ SOLN
INTRAMUSCULAR | Status: AC
  Filled 2022-03-21: qty 2

## 2022-03-21 MED ORDER — OXYCODONE HCL 5 MG PO TABS: 5.0000 mg | ORAL_TABLET | Freq: Once | ORAL | Status: DC | PRN

## 2022-03-21 MED ORDER — PROMETHAZINE HCL 25 MG/ML IJ SOLN: 6.2500 mg | INTRAMUSCULAR | Status: DC | PRN

## 2022-03-21 MED ORDER — ONDANSETRON HCL 4 MG/2ML IJ SOLN
INTRAMUSCULAR | Status: DC | PRN
  Administered 2022-03-21: 4 mg via INTRAVENOUS

## 2022-03-21 MED ORDER — DEXAMETHASONE SODIUM PHOSPHATE 4 MG/ML IJ SOLN
INTRAMUSCULAR | Status: DC | PRN
  Administered 2022-03-21: 10 mg via INTRAVENOUS

## 2022-03-21 MED ORDER — CEFAZOLIN SODIUM-DEXTROSE 2-4 GM/100ML-% IV SOLN
2.0000 g | INTRAVENOUS | Status: AC
  Administered 2022-03-21: 2 g via INTRAVENOUS

## 2022-03-21 MED ORDER — EPHEDRINE 5 MG/ML INJ
INTRAVENOUS | Status: AC
  Filled 2022-03-21: qty 5

## 2022-03-21 MED ORDER — ACETAMINOPHEN 500 MG PO TABS
1000.0000 mg | ORAL_TABLET | Freq: Once | ORAL | Status: AC
  Administered 2022-03-21: 1000 mg via ORAL

## 2022-03-21 MED ORDER — KETOROLAC TROMETHAMINE 30 MG/ML IJ SOLN: 30.0000 mg | Freq: Once | INTRAMUSCULAR | Status: DC | PRN

## 2022-03-21 MED ORDER — CHLORHEXIDINE GLUCONATE CLOTH 2 % EX PADS: 6.0000 | MEDICATED_PAD | Freq: Once | CUTANEOUS | Status: DC

## 2022-03-21 MED ORDER — SODIUM CHLORIDE 0.9 % IV SOLN
INTRAVENOUS | Status: DC | PRN
  Administered 2022-03-21: 500 mL

## 2022-03-21 MED ORDER — CEFAZOLIN SODIUM-DEXTROSE 2-4 GM/100ML-% IV SOLN
INTRAVENOUS | Status: AC
  Filled 2022-03-21: qty 100

## 2022-03-21 MED ORDER — DEXMEDETOMIDINE HCL IN NACL 80 MCG/20ML IV SOLN
INTRAVENOUS | Status: DC | PRN
  Administered 2022-03-21: 8 ug via BUCCAL
  Administered 2022-03-21: 4 ug via BUCCAL

## 2022-03-21 MED ORDER — LIDOCAINE HCL (CARDIAC) PF 100 MG/5ML IV SOSY
PREFILLED_SYRINGE | INTRAVENOUS | Status: DC | PRN
  Administered 2022-03-21: 100 mg via INTRAVENOUS

## 2022-03-21 MED ORDER — SODIUM CHLORIDE 0.9 % IV SOLN
INTRAVENOUS | Status: AC
  Filled 2022-03-21: qty 10

## 2022-03-21 MED ORDER — ONDANSETRON HCL 4 MG/2ML IJ SOLN
INTRAMUSCULAR | Status: AC
  Filled 2022-03-21: qty 2

## 2022-03-21 MED ORDER — ACETAMINOPHEN 325 MG RE SUPP: 650.0000 mg | RECTAL | Status: DC | PRN

## 2022-03-21 MED ORDER — FENTANYL CITRATE (PF) 100 MCG/2ML IJ SOLN: 25.0000 ug | INTRAMUSCULAR | Status: DC | PRN

## 2022-03-21 MED ORDER — DEXAMETHASONE SODIUM PHOSPHATE 10 MG/ML IJ SOLN
INTRAMUSCULAR | Status: AC
  Filled 2022-03-21: qty 1

## 2022-03-21 MED ORDER — ACETAMINOPHEN 325 MG PO TABS: 650.0000 mg | ORAL_TABLET | ORAL | Status: DC | PRN

## 2022-03-21 MED ORDER — MIDAZOLAM HCL 2 MG/2ML IJ SOLN
INTRAMUSCULAR | Status: AC
  Filled 2022-03-21: qty 2

## 2022-03-21 MED ORDER — SODIUM CHLORIDE 0.9% FLUSH: 3.0000 mL | INTRAVENOUS | Status: DC | PRN

## 2022-03-21 MED ORDER — SODIUM CHLORIDE 0.9% FLUSH: 3.0000 mL | Freq: Two times a day (BID) | INTRAVENOUS | Status: DC

## 2022-03-21 MED ORDER — SUCCINYLCHOLINE CHLORIDE 200 MG/10ML IV SOSY
PREFILLED_SYRINGE | INTRAVENOUS | Status: DC | PRN
  Administered 2022-03-21: 60 mg via INTRAVENOUS

## 2022-03-21 MED ORDER — PHENYLEPHRINE HCL (PRESSORS) 10 MG/ML IV SOLN
INTRAVENOUS | Status: DC | PRN
  Administered 2022-03-21: 80 ug via INTRAVENOUS

## 2022-03-21 MED ORDER — OXYCODONE HCL 5 MG/5ML PO SOLN: 5.0000 mg | Freq: Once | ORAL | Status: DC | PRN

## 2022-03-21 MED ORDER — ACETAMINOPHEN 500 MG PO TABS
ORAL_TABLET | ORAL | Status: AC
  Filled 2022-03-21: qty 2

## 2022-03-21 MED ORDER — PROPOFOL 10 MG/ML IV BOLUS
INTRAVENOUS | Status: DC | PRN
  Administered 2022-03-21: 150 mg via INTRAVENOUS
  Administered 2022-03-21 (×2): 50 mg via INTRAVENOUS

## 2022-03-21 MED ORDER — MIDAZOLAM HCL 5 MG/5ML IJ SOLN
INTRAMUSCULAR | Status: DC | PRN
  Administered 2022-03-21: 2 mg via INTRAVENOUS

## 2022-03-21 MED ORDER — FENTANYL CITRATE (PF) 100 MCG/2ML IJ SOLN
INTRAMUSCULAR | Status: DC | PRN
  Administered 2022-03-21: 50 ug via INTRAVENOUS
  Administered 2022-03-21: 100 ug via INTRAVENOUS
  Administered 2022-03-21: 50 ug via INTRAVENOUS

## 2022-03-21 MED ORDER — EPHEDRINE SULFATE (PRESSORS) 50 MG/ML IJ SOLN
INTRAMUSCULAR | Status: DC | PRN
  Administered 2022-03-21: 10 mg via INTRAVENOUS
  Administered 2022-03-21 (×3): 5 mg via INTRAVENOUS

## 2022-03-21 MED ORDER — LIDOCAINE-EPINEPHRINE 1 %-1:100000 IJ SOLN
INTRAMUSCULAR | Status: DC | PRN
  Administered 2022-03-21: 10 mL

## 2022-03-21 MED ORDER — OXYCODONE HCL 5 MG PO TABS: 5.0000 mg | ORAL_TABLET | ORAL | Status: DC | PRN

## 2022-03-21 MED ORDER — PHENYLEPHRINE 80 MCG/ML (10ML) SYRINGE FOR IV PUSH (FOR BLOOD PRESSURE SUPPORT)
PREFILLED_SYRINGE | INTRAVENOUS | Status: AC
  Filled 2022-03-21: qty 10

## 2022-03-21 SURGICAL SUPPLY — 69 items
ADH SKN CLS APL DERMABOND .7 (GAUZE/BANDAGES/DRESSINGS) ×2
BAG DECANTER FOR FLEXI CONT (MISCELLANEOUS) ×1 IMPLANT
BINDER BREAST LRG (GAUZE/BANDAGES/DRESSINGS) IMPLANT
BINDER BREAST MEDIUM (GAUZE/BANDAGES/DRESSINGS) IMPLANT
BINDER BREAST XLRG (GAUZE/BANDAGES/DRESSINGS) IMPLANT
BINDER BREAST XXLRG (GAUZE/BANDAGES/DRESSINGS) IMPLANT
BIOPATCH RED 1 DISK 7.0 (GAUZE/BANDAGES/DRESSINGS) IMPLANT
BLADE SURG 15 STRL LF DISP TIS (BLADE) ×1 IMPLANT
BLADE SURG 15 STRL SS (BLADE) ×1
CANISTER SUCT 1200ML W/VALVE (MISCELLANEOUS) ×1 IMPLANT
COVER BACK TABLE 60X90IN (DRAPES) ×1 IMPLANT
COVER MAYO STAND STRL (DRAPES) ×1 IMPLANT
DERMABOND ADVANCED .7 DNX12 (GAUZE/BANDAGES/DRESSINGS) IMPLANT
DRAIN CHANNEL 19F RND (DRAIN) IMPLANT
DRAPE LAPAROSCOPIC ABDOMINAL (DRAPES) ×1 IMPLANT
DRSG MEPILEX POST OP 4X8 (GAUZE/BANDAGES/DRESSINGS) ×2 IMPLANT
ELECT BLADE 4.0 EZ CLEAN MEGAD (MISCELLANEOUS) ×1
ELECT COATED BLADE 2.86 ST (ELECTRODE) ×1 IMPLANT
ELECT REM PT RETURN 9FT ADLT (ELECTROSURGICAL) ×1
ELECTRODE BLDE 4.0 EZ CLN MEGD (MISCELLANEOUS) ×1 IMPLANT
ELECTRODE REM PT RTRN 9FT ADLT (ELECTROSURGICAL) ×1 IMPLANT
EVACUATOR SILICONE 100CC (DRAIN) IMPLANT
FUNNEL KELLER 2 DISP (MISCELLANEOUS) IMPLANT
GAUZE PAD ABD 8X10 STRL (GAUZE/BANDAGES/DRESSINGS) ×2 IMPLANT
GAUZE SPONGE 4X4 12PLY STRL LF (GAUZE/BANDAGES/DRESSINGS) IMPLANT
GLOVE BIO SURGEON STRL SZ 6.5 (GLOVE) ×2 IMPLANT
GLOVE BIO SURGEON STRL SZ7.5 (GLOVE) ×1 IMPLANT
GOWN STRL REUS W/ TWL LRG LVL3 (GOWN DISPOSABLE) ×3 IMPLANT
GOWN STRL REUS W/TWL LRG LVL3 (GOWN DISPOSABLE) ×3
IMPL BREAST GEL HP 375CC (Breast) IMPLANT
IMPLANT BREAST GEL HP 375CC (Breast) ×2 IMPLANT
IV NS 1000ML (IV SOLUTION)
IV NS 1000ML BAXH (IV SOLUTION) IMPLANT
NDL HYPO 25X1 1.5 SAFETY (NEEDLE) ×1 IMPLANT
NDL SAFETY ECLIP 18X1.5 (MISCELLANEOUS) ×1 IMPLANT
NEEDLE HYPO 25X1 1.5 SAFETY (NEEDLE) ×1 IMPLANT
PACK BASIN DAY SURGERY FS (CUSTOM PROCEDURE TRAY) ×1 IMPLANT
PENCIL SMOKE EVACUATOR (MISCELLANEOUS) ×1 IMPLANT
PIN SAFETY STERILE (MISCELLANEOUS) IMPLANT
SHEET MEDIUM DRAPE 40X70 STRL (DRAPES) IMPLANT
SIZER BREAST GEL REUSE 430CC (SIZER) ×1
SIZER BREAST REUSE 375CC (SIZER) ×1
SIZER BREAST REUSE 400CC (SIZER) ×1
SIZER BRST GEL REUSE 430CC (SIZER) IMPLANT
SIZER BRST REUSE 400CC (SIZER) IMPLANT
SIZER BRST REUSE P5.3 375CC (SIZER) IMPLANT
SLEEVE SCD COMPRESS KNEE MED (STOCKING) ×1 IMPLANT
SPIKE FLUID TRANSFER (MISCELLANEOUS) IMPLANT
SPONGE T-LAP 18X18 ~~LOC~~+RFID (SPONGE) ×2 IMPLANT
STRIP SUTURE WOUND CLOSURE 1/2 (MISCELLANEOUS) IMPLANT
SUT MNCRL AB 3-0 PS2 18 (SUTURE) IMPLANT
SUT MNCRL AB 4-0 PS2 18 (SUTURE) IMPLANT
SUT MON AB 3-0 SH 27 (SUTURE)
SUT MON AB 3-0 SH27 (SUTURE) IMPLANT
SUT MON AB 5-0 PS2 18 (SUTURE) IMPLANT
SUT PDS 3-0 CT2 (SUTURE) ×1
SUT PDS AB 2-0 CT2 27 (SUTURE) IMPLANT
SUT PDS II 3-0 CT2 27 ABS (SUTURE) IMPLANT
SUT SILK 3 0 PS 1 (SUTURE) IMPLANT
SUT VIC AB 3-0 SH 27 (SUTURE) ×1
SUT VIC AB 3-0 SH 27X BRD (SUTURE) IMPLANT
SUT VICRYL 4-0 PS2 18IN ABS (SUTURE) IMPLANT
SYR BULB IRRIG 60ML STRL (SYRINGE) ×1 IMPLANT
SYR CONTROL 10ML LL (SYRINGE) ×1 IMPLANT
TOWEL GREEN STERILE FF (TOWEL DISPOSABLE) ×2 IMPLANT
TRAY DSU PREP LF (CUSTOM PROCEDURE TRAY) ×1 IMPLANT
TUBE CONNECTING 20X1/4 (TUBING) ×1 IMPLANT
UNDERPAD 30X36 HEAVY ABSORB (UNDERPADS AND DIAPERS) ×2 IMPLANT
YANKAUER SUCT BULB TIP NO VENT (SUCTIONS) ×1 IMPLANT

## 2022-03-21 NOTE — Discharge Instructions (Addendum)
INSTRUCTIONS FOR AFTER BREAST SURGERY   You will likely have some questions about what to expect following your operation.  The following information will help you and your family understand what to expect when you are discharged from the hospital.  It is important to follow these guidelines to help ensure a smooth recovery and reduce complication.  Postoperative instructions include information on: diet, wound care, medications and physical activity.  AFTER SURGERY Expect to go home after the procedure.  In some cases, you may need to spend one night in the hospital for observation.  DIET Breast surgery does not require a specific diet.  However, the healthier you eat the better your body will heal. It is important to increasing your protein intake.  This means limiting the foods with sugar and carbohydrates.  Focus on vegetables and some meat.  If you have liposuction during your procedure be sure to drink water.  If your urine is bright yellow, then it is concentrated, and you need to drink more water.  As a general rule after surgery, you should have 8 ounces of water every hour while awake.  If you find you are persistently nauseated or unable to take in liquids let us know.  NO TOBACCO USE or EXPOSURE.  This will slow your healing process and lead to a wound.  WOUND CARE Leave the binder on for 3 days . Use fragrance free soap like Dial, Dove or [place].   After 3 days you can remove the binder to shower. Once dry apply binder or sports bra. If you have liposuction you will have a soft and spongy dressing (Lipofoam) that helps prevent creases in your skin.  Remove before you shower and then replace it.  It is also available on [employer]. If you have steri-strips / tape directly attached to your skin leave them in place. It is OK to get these wet.   No baths, pools or hot tubs for four weeks. We close your incision to leave the smallest and best-looking scar. No ointment or creams on your incisions  for four weeks.  No Neosporin (Too many skin reactions).  A few weeks after surgery you can use Mederma and start massaging the scar. We ask you to wear your binder or sports bra for the first 6 weeks around the clock, including while sleeping. This provides added comfort and helps reduce the fluid accumulation at the surgery site. NO Ice or heating pads to the operative site.  You have a very high risk of a BURN before you feel the temperature change.  ACTIVITY No heavy lifting until cleared by the doctor.  This usually means no more than a half-gallon of milk.  It is OK to walk and climb stairs. Moving your legs is very important to decrease your risk of a blood clot.  It will also help keep you from getting deconditioned.  Every 1 to 2 hours get up and walk for 5 minutes. This will help with a quicker recovery back to normal.  Let pain be your guide so you don't do too much.  This time is for you to recover.  You will be more comfortable if you sleep and rest with your head elevated either with a few pillows under you or in a recliner.  No stomach sleeping for a three months.  WORK Everyone returns to work at different times. As a rough guide, most people take at least 1 - 2 weeks off prior to returning to work. If  you need documentation for your job, give the forms to the front staff at the clinic.  DRIVING Arrange for someone to bring you home from the hospital after your surgery.  You may be able to drive a few days after surgery but not while taking any narcotics or valium.  BOWEL MOVEMENTS Constipation can occur after anesthesia and while taking pain medication.  It is important to stay ahead for your comfort.  We recommend taking Milk of Magnesia (2 tablespoons; twice a day) while taking the pain pills.  MEDICATIONS You may be prescribed should start after surgery At your preoperative visit for you history and physical you may have been given the following medications: An antibiotic: Start  this medication when you get home and take according to the instructions on the bottle. Zofran 4 mg:  This is to treat nausea and vomiting.  You can take this every 6 hours as needed and only if needed. Valium 2 mg for breast cancer patients: This is for muscle tightness if you have an implant or expander. This will help relax your muscle which also helps with pain control.  This can be taken every 12 hours as needed. Don't drive after taking this medication. Norco (hydrocodone/acetaminophen) 5/325 mg:  This is only to be used after you have taken the Motrin or the Tylenol. Every 8 hours as needed.   Over the counter Medication to take: Ibuprofen (Motrin) 600 mg:  Take this every 6 hours.  If you have additional pain then take 500 mg of the Tylenol every 8 hours.  Only take the Norco after you have tried these two. MiraLAX or Milk of Magnesia: Take this according to the bottle if you take the [place] Call your surgeon's office if any of the following occur: Fever 101 degrees F or greater Excessive bleeding or fluid from the incision site. Pain that increases over time without aid from the medications Redness, warmth, or pus draining from incision sites Persistent nausea or inability to take in liquids Severe misshapen area that underwent the operation.  You may have Tylenol again after 4:30pm, if needed.  Post Anesthesia Home Care Instructions  Activity: Get plenty of rest for the remainder of the day. A responsible individual must stay with you for 24 hours following the procedure.  For the next 24 hours, DO NOT: -Drive a car -Paediatric nurse -Drink alcoholic beverages -Take any medication unless instructed by your physician -Make any legal decisions or sign important papers.  Meals: Start with liquid foods such as gelatin or soup. Progress to regular foods as tolerated. Avoid greasy, spicy, heavy foods. If nausea and/or vomiting occur, drink only clear liquids until  the nausea and/or vomiting subsides. Call your physician if vomiting continues.  Special Instructions/Symptoms: Your throat may feel dry or sore from the anesthesia or the breathing tube placed in your throat during surgery. If this causes discomfort, gargle with warm salt water. The discomfort should disappear within 24 hours.  If you had a scopolamine patch placed behind your ear for the management of post- operative nausea and/or vomiting:  1. The medication in the patch is effective for 72 hours, after which it should be removed.  Wrap patch in a tissue and discard in the trash. Wash hands thoroughly with soap and water. 2. You may remove the patch earlier than 72 hours if you experience unpleasant side effects which may include dry mouth, dizziness or visual disturbances. 3. Avoid touching the patch. Wash your  hands with soap and water after contact with the patch.

## 2022-03-21 NOTE — Transfer of Care (Signed)
Immediate Anesthesia Transfer of Care Note  Patient: Shelly Hensley  Procedure(s) Performed: REMOVAL OF TISSUE EXPANDER AND PLACEMENT OF IMPLANT (Bilateral: Breast)  Patient Location: PACU  Anesthesia Type:General  Level of Consciousness: drowsy  Airway & Oxygen Therapy: Patient Spontanous Breathing and Patient connected to face mask oxygen  Post-op Assessment: Report given to RN and Post -op Vital signs reviewed and stable  Post vital signs: Reviewed and stable  Last Vitals:  Vitals Value Taken Time  BP 114/73 03/21/22 1241  Temp    Pulse 97 03/21/22 1242  Resp 16 03/21/22 1242  SpO2 100 % 03/21/22 1242  Vitals shown include unvalidated device data.  Last Pain:  Vitals:   03/21/22 1019  TempSrc: Oral  PainSc: 0-No pain         Complications: No notable events documented.

## 2022-03-21 NOTE — Interval H&P Note (Signed)
History and Physical Interval Note:  03/21/2022 10:57 AM  Shelly Hensley  has presented today for surgery, with the diagnosis of Ductal carcinoma in situ.  The various methods of treatment have been discussed with the patient and family. After consideration of risks, benefits and other options for treatment, the patient has consented to  Procedure(s): REMOVAL OF TISSUE EXPANDER AND PLACEMENT OF IMPLANT (Bilateral) as a surgical intervention.  The patient's history has been reviewed, patient examined, no change in status, stable for surgery.  I have reviewed the patient's chart and labs.  Questions were answered to the patient's satisfaction.     Loel Lofty Promyse Ardito

## 2022-03-21 NOTE — Anesthesia Postprocedure Evaluation (Signed)
Anesthesia Post Note  Patient: [name]  Procedure(s) Performed: REMOVAL OF TISSUE EXPANDER AND PLACEMENT OF IMPLANT (Bilateral: Breast)     Patient location during evaluation: PACU Anesthesia Type: General Level of consciousness: awake Pain management: pain level controlled Vital Signs Assessment: post-procedure vital signs reviewed and stable Respiratory status: spontaneous breathing, nonlabored ventilation and respiratory function stable Cardiovascular status: blood pressure returned to baseline and stable Postop Assessment: no apparent nausea or vomiting Anesthetic complications: no   No notable events documented.  Last Vitals:  Vitals:   03/21/22 1300 03/21/22 1315  BP: 110/71 111/71  Pulse: 69 61  Resp: 19 17  Temp:    SpO2: 97% 98%    Last Pain:  Vitals:   03/21/22 1315  TempSrc:   PainSc: 0-No pain                 Nilda Simmer

## 2022-03-21 NOTE — Anesthesia Procedure Notes (Signed)
Procedure Name: Intubation Date/Time: 03/21/2022 11:30 AM  Performed by: Lavonia Dana, CRNAPre-anesthesia Checklist: Patient identified, Emergency Drugs available, Suction available and Patient being monitored Patient Re-evaluated:Patient Re-evaluated prior to induction Oxygen Delivery Method: Circle system utilized Preoxygenation: Pre-oxygenation with 100% oxygen Induction Type: IV induction Ventilation: Mask ventilation without difficulty Laryngoscope Size: Mac and 3 Grade View: Grade I Tube type: Oral Tube size: 7.0 mm Number of attempts: 1 Airway Equipment and Method: Stylet and Bite block Placement Confirmation: ETT inserted through vocal cords under direct vision, positive ETCO2 and breath sounds checked- equal and bilateral Secured at: 22 cm Tube secured with: Tape Dental Injury: Teeth and Oropharynx as per pre-operative assessment

## 2022-03-21 NOTE — Op Note (Signed)
Op report Bilateral Exchange   DATE OF OPERATION: 03/21/2022  LOCATION: [facility]  SURGICAL DIVISION: Plastic Surgery  PREOPERATIVE DIAGNOSIS:  1.History of left sided breast cancer.  2. Acquired absence of bilateral breast.   POSTOPERATIVE DIAGNOSIS:  Same as preoperative diagnosis  PROCEDURE:  1. Bilateral exchange of tissue expanders for implants.  2. Bilateral capsulotomies for implant respositioning.  SURGEON: Briannon Boggio [place]anger [place] Feldpausch, DO  ASSISTANT: Roetta Sessions, PA  ANESTHESIA:  General.   COMPLICATIONS: None.   IMPLANTS: Left - Mentor Smooth Round Ultra High Profile Gel 400cc. Ref #[id number].  Serial Number [id number] Right - Mentor Smooth Round Ultra High Profile Gel 400cc. Ref #[id number].  Serial Number [id number]  INDICATIONS FOR PROCEDURE:  The patient, Shelly Hensley, is a 41 y.o. female born on [birth year], is here for treatment after bilateral mastectomies.  She had tissue expanders placed at the time of mastectomies. She now presents for exchange of her expanders for implants.  She requires capsulotomies to better position the implants. MRN: [id number]  CONSENT:  Informed consent was obtained directly from the patient. Risks, benefits and alternatives were fully discussed. Specific risks including but not limited to bleeding, infection, hematoma, seroma, scarring, pain, implant infection, implant extrusion, capsular contracture, asymmetry, wound healing problems, and need for further surgery were all discussed. The patient did have an ample opportunity to have her questions answered to her satisfaction.   DESCRIPTION OF PROCEDURE:  The patient was taken to the operating room. SCDs were placed and IV antibiotics were given. The patient's chest was prepped and draped in a sterile fashion. A time out was performed and the implants to be used were identified.    On the right breast: Local with epinephrine was used to infiltrate at the  incision site. The old mastectomy scar was incised.  The mastectomy flaps from the superior and inferior flaps were raised over the pectoralis major muscle for several centimeters to minimize tension for the closure. The pectoralis was split inferior to the skin incision to expose and remove the tissue expander.  Inspection of the pocket showed a normal healthy capsule and good integration of the biologic matrix.  The pocket was irrigated with antibiotic solution. superior capsulotomies were performed to allow for breast pocket expansion.  Measurements were made and a sizer used to confirm adequate pocket size for the implant dimensions.  The 430 and 400 cc sizers were too large. Hemostasis was ensured with electrocautery. New gloves were placed. The keller funnel was utilized. The implant was soaked in antibiotic solution and then placed in the pocket and oriented appropriately. The pectoralis major muscle and capsule on the anterior surface were re-closed with a 3-0 PDS suture. The remaining skin was closed with 3-0 Monocryl deep dermal and 4-0 Monocryl subcuticular stitches.   On the left breast: The old mastectomy scar was incised.  The mastectomy flaps from the superior and inferior flaps were raised over the pectoralis major muscle for several centimeters to minimize tension for the closure. The pectoralis was split inferior to the skin incision to expose and remove the tissue expander.  Inspection of the pocket showed a normal healthy capsule and good integration of the biologic matrix. Superior, lateral and medial capsulotomies were performed to allow for breast pocket expansion.  Measurements were made and a sizer utilized to confirm adequate pocket size for the implant dimensions.  Hemostasis was ensured with the electrocautery.  New gloves were applied. The implant was soaked in antibiotic solution and placed in  the pocket and oriented appropriately. The pectoralis major muscle and capsule on the  anterior surface were re-closed with a 3-0 PDS suture. The remaining skin was closed with 3-0 Monocryl deep dermal and 4-0 Monocryl subcuticular stitches.  Dermabond was applied to the incision site. A breast binder and ABDs were placed.  The patient was allowed to wake from anesthesia and taken to the recovery room in satisfactory condition.   The advanced practice practitioner (APP) assisted throughout the case.  The APP was essential in retraction and counter traction when needed to make the case progress smoothly.  This retraction and assistance made it possible to see the tissue plans for the procedure.  The assistance was needed for blood control, tissue re-approximation and assisted with closure of the incision site.

## 2022-03-22 ENCOUNTER — Encounter (HOSPITAL_BASED_OUTPATIENT_CLINIC_OR_DEPARTMENT_OTHER): Payer: Self-pay | Admitting: Plastic Surgery

## 2022-03-26 ENCOUNTER — Encounter: Payer: Self-pay | Admitting: Plastic Surgery

## 2022-03-26 ENCOUNTER — Ambulatory Visit (INDEPENDENT_AMBULATORY_CARE_PROVIDER_SITE_OTHER): Payer: Commercial Managed Care - PPO | Admitting: Plastic Surgery

## 2022-03-26 VITALS — BP 145/92 | HR 68 | Resp 16

## 2022-03-26 DIAGNOSIS — D0512 Intraductal carcinoma in situ of left breast: Secondary | ICD-10-CM

## 2022-03-26 NOTE — Progress Notes (Signed)
   Subjective:    Patient ID: Shelly Hensley, female    DOB: 15-Apr-1980, 42 y.o.   MRN: [id number]  The patient is a 42 year old female here with her husband for follow-up after undergoing breast reconstruction.  She had bilateral mastectomies with expander placement in July.  She then had removal of the right breast expander and August and then replacement in September.  She continued with expansion very nicely and was able to undergo removal of the expanders and placement of silicone implants last week.  She has Mentor smooth round ultrahigh profile gel 400 cc implants bilaterally.  She has swelling and bruising as expected but no sign of hematoma.      Review of Systems  Constitutional: Negative.   Eyes: Negative.   Respiratory: Negative.    Cardiovascular: Negative.   Endocrine: Negative.        Objective:   Physical Exam Constitutional:      Appearance: Normal appearance.  Cardiovascular:     Rate and Rhythm: Normal rate.     Pulses: Normal pulses.  Pulmonary:     Effort: Pulmonary effort is normal.  Skin:    Capillary Refill: Capillary refill takes less than 2 seconds.  Neurological:     Mental Status: She is alert and oriented to person, place, and time.  Psychiatric:        Mood and Affect: Mood normal.        Behavior: Behavior normal.         Assessment & Plan:     ICD-10-CM   1. Ductal carcinoma in situ (DCIS) of left breast  D05.12       I removed the dressings.  The Steri-Strips are still in place.  Continue with the sports bra.  Do not increase lifting for another couple of weeks.  Will plan to see her back in 2 weeks and we will get pictures at that time.

## 2022-04-11 ENCOUNTER — Ambulatory Visit (INDEPENDENT_AMBULATORY_CARE_PROVIDER_SITE_OTHER): Payer: Commercial Managed Care - PPO | Admitting: Surgical

## 2022-04-11 ENCOUNTER — Encounter: Payer: Self-pay | Admitting: Surgical

## 2022-04-11 VITALS — BP 162/96 | HR 79

## 2022-04-11 DIAGNOSIS — D0512 Intraductal carcinoma in situ of left breast: Secondary | ICD-10-CM

## 2022-04-11 NOTE — Progress Notes (Signed)
Patient is a 42 year old female here for follow-up after undergoing bilateral breast tissue expander exchange to bilateral breast silicone implants with Dr. Marla Roe, she had 400 cc Mentor smooth round ultrahigh profile implants placed bilaterally on 03/21/2022.  She is 3 weeks postop.  She is here with her husband.  She reports overall she is doing really well, she has not had any issues.  Chaperone present on exam On exam bilateral breast incisions are intact, healing well.  Steri-Strips were removed.  There is no erythema or cellulitic changes noted.  She overall has good shape and symmetry.   She does have some mild volume loss medially and superior poles bilaterally.  No subcutaneous fluid collections noted palpation.  A/P:  Recommend continue with compressive garments, avoid strenuous activities or heavy lifting.  Continue to avoid submerging the incisions in pools, hot tubs or bathtub.  She previously had a reaction to the skin of the scar cream, recommended doing a another test with it on her arm/wrist.  If she does not have a reaction from placing it on the arm/wrist, recommend starting to use it in 3 weeks over the breast incisions.  We also discussed the use of silicone scar strips or another scar cream called Silagen.  I do not see any signs of infection on exam, I would like to see her back in 3 weeks prior to her returning to work as a PTA.  Recommend calling with questions or concerns.  Pictures were obtained of the patient and placed in the chart with the patient's or guardian's permission.

## 2022-04-15 ENCOUNTER — Encounter: Payer: Self-pay | Admitting: Plastic Surgery

## 2022-04-16 ENCOUNTER — Ambulatory Visit (INDEPENDENT_AMBULATORY_CARE_PROVIDER_SITE_OTHER): Payer: Commercial Managed Care - PPO | Admitting: Physician Assistant

## 2022-04-16 ENCOUNTER — Encounter: Payer: Self-pay | Admitting: Physician Assistant

## 2022-04-16 VITALS — BP 144/91 | HR 73

## 2022-04-16 DIAGNOSIS — D0512 Intraductal carcinoma in situ of left breast: Secondary | ICD-10-CM

## 2022-04-16 NOTE — Progress Notes (Signed)
This is a 42 year old female who is status post bilateral breast tissue expander exchange of bilateral 400 cc Mentor smooth round ultrahigh profile breast silicone implants by Dr. Marla Roe on 03/21/2022.  The patient called yesterday noting one of her stitches was causing irritation along the right lateral breast.  No infectious signs or symptoms reported.  Chaperone present.  On exam bilateral breasts are symmetric with excellent shape, incisions are clean dry and intact with exception of the right incision with a very superficial scabbing medially and some irritation around the right lateral Monocryl stitch.  Bilateral breast flaps are viable.  I did remove the lateral stitch which was causing the irritation.  I advised the patient to apply Vaseline to the areas of irritation once these have healed over she may return using the silicone strips.  She has a follow-up appointment in early February we will see her at that time unless she develops any new or worsening signs or symptoms in that case she will reach out to the office immediately.  The patient verbalized understanding and agreement to today's plan had no further questions or concerns.

## 2022-04-25 ENCOUNTER — Encounter: Payer: Commercial Managed Care - PPO | Admitting: Surgical

## 2022-05-02 ENCOUNTER — Encounter: Payer: Self-pay | Admitting: Plastic Surgery

## 2022-05-02 ENCOUNTER — Ambulatory Visit (INDEPENDENT_AMBULATORY_CARE_PROVIDER_SITE_OTHER): Payer: Commercial Managed Care - PPO | Admitting: Surgical

## 2022-05-02 DIAGNOSIS — Z1379 Encounter for other screening for genetic and chromosomal anomalies: Secondary | ICD-10-CM

## 2022-05-02 DIAGNOSIS — Z1509 Genetic susceptibility to other malignant neoplasm: Secondary | ICD-10-CM

## 2022-05-02 DIAGNOSIS — Z1501 Genetic susceptibility to malignant neoplasm of breast: Secondary | ICD-10-CM

## 2022-05-02 DIAGNOSIS — Z1589 Genetic susceptibility to other disease: Secondary | ICD-10-CM

## 2022-05-02 DIAGNOSIS — Z1502 Genetic susceptibility to malignant neoplasm of ovary: Secondary | ICD-10-CM

## 2022-05-02 DIAGNOSIS — D0512 Intraductal carcinoma in situ of left breast: Secondary | ICD-10-CM

## 2022-05-02 NOTE — Progress Notes (Signed)
Patient is a very pleasant 42 year old female here with her mother for follow-up on her bilateral breast reconstruction.  She underwent bilateral breast tissue expander exchange to bilateral breast implants with Dr. Marla Roe on 03/21/2022.  She is 6 weeks postop.   She had 400 cc Mentor smooth round ultrahigh profile gel implants placed. She was last seen in the office on 04/16/2022 for concerns related to sutures causing irritation along the right lateral breast incision.  Lateral stitch was removed which was causing irritation.  Was recommended to apply Vaseline to the areas of irritation.  She reports that is doing much better, she does have 2 areas of the right breast that still have areas where sutures are irritating the skin.  There is no incisional dehiscence or incisional openings.  I am able to palpate the sutures along the junction of the pectoralis major muscle and the ADM.  There is no overlying skin changes.  No erythema or cellulitic changes of either breast.  No subcutaneous fluid collections noted.  A/P:  She can continue wearing silicone scar strips, recommend avoiding placing over the lateral right breast incision where she has a suture just beneath the skin.  I was able to remove some residual suture along the medial right breast incision.  She tolerated this well.  I do not see any signs of infection on exam, we discussed she can return to work on Monday with no restrictions, increase activity as able and tolerated.  I would like to see her back in 4 weeks to reevaluate.  We did briefly discuss fat grafting as an option for improved symmetry and shape as well as areolar tattooing.  She is going to think this over, she is aware that this is not necessary but available to her.

## 2022-05-03 ENCOUNTER — Encounter: Payer: Commercial Managed Care - PPO | Admitting: Surgical

## 2022-05-31 ENCOUNTER — Ambulatory Visit (INDEPENDENT_AMBULATORY_CARE_PROVIDER_SITE_OTHER): Payer: Commercial Managed Care - PPO | Admitting: Surgical

## 2022-05-31 DIAGNOSIS — Z1589 Genetic susceptibility to other disease: Secondary | ICD-10-CM

## 2022-05-31 DIAGNOSIS — Z1502 Genetic susceptibility to malignant neoplasm of ovary: Secondary | ICD-10-CM

## 2022-05-31 DIAGNOSIS — D0512 Intraductal carcinoma in situ of left breast: Secondary | ICD-10-CM

## 2022-05-31 DIAGNOSIS — Z1379 Encounter for other screening for genetic and chromosomal anomalies: Secondary | ICD-10-CM

## 2022-05-31 DIAGNOSIS — Z1501 Genetic susceptibility to malignant neoplasm of breast: Secondary | ICD-10-CM

## 2022-05-31 DIAGNOSIS — Z1509 Genetic susceptibility to other malignant neoplasm: Secondary | ICD-10-CM

## 2022-05-31 NOTE — Progress Notes (Signed)
Patient is a very pleasant 42 year old female here for follow-up on her bilateral breast reconstruction, she underwent exchange of bilateral breast tissue expanders for bilateral silicone breast implants with Dr. Marla Roe on 03/21/2022.  She is 10 weeks postop.  She had Mentor 400 cc smooth round ultrahigh profile gel implants placed bilaterally.  She reports she is doing really well, she has been active at work and both outside of work and has not had any issues.  She has been exercising without any issues.   Chaperone present on exam On exam bilateral mastectomy incisions are intact and healing well.  I do not appreciate any subcutaneous fluid collections, no erythema or cellulitic changes of either breast.  Bilateral breast implants are soft.  The sutures have all dissolved and there were no sutures protruding through the skin at this time as the previously was.  A/P:  We discussed no restrictions at this time, she can go without wearing compressive garments 24/7, she can begin wearing normal bras as tolerated.  There is no signs of infection or concern on exam at this time.  We discussed options for nipple areola tattooing as well as fat grafting to bilateral breast, but discussed that this could be done at a later point.  She is in agreement with this and would like to give it some time.  I would like to see her back in 4 months for reevaluation.  We discussed calling with questions or concerns prior to that.

## 2022-09-20 ENCOUNTER — Ambulatory Visit: Payer: Commercial Managed Care - PPO | Admitting: Surgical

## 2022-09-23 NOTE — Progress Notes (Unsigned)
Referring Provider Abner Greenspan, MD [place],  [place] [address]   CC: No chief complaint on file.     RICHARD KEENEN is an 42 y.o. female.  HPI: Patient is a pleasant 42 year old female s/p bilateral breast reconstruction with implant exchange performed 03/21/2022 by Dr. Ulice Bold who presents to clinic for 14-month follow-up.  She was last seen here in clinic on 05/31/2022.  At that time, exam was entirely benign.  No further restrictions at that time.  Follow-up in 4 months for reevaluation.  Today,   Allergies  Allergen Reactions   Other Anaphylaxis and Other (See Comments)    Tree nuts    Outpatient Encounter Medications as of 09/24/2022  Medication Sig   acetaminophen (TYLENOL) 500 MG tablet Take 500-1,000 mg by mouth 2 (two) times daily as needed for mild pain.   ALOE VERA PO Take 1 capsule by mouth daily.   Cholecalciferol (VITAMIN D-3 PO) Take 1 capsule by mouth daily.   diazepam (VALIUM) 2 MG tablet Take 1 tablet (2 mg total) by mouth every 12 (twelve) hours as needed for muscle spasms.   ibuprofen (ADVIL) 200 MG tablet Take 600 mg by mouth 2 (two) times daily as needed for headache or mild pain.   loratadine (CLARITIN) 10 MG tablet Take 10 mg by mouth daily.   ondansetron (ZOFRAN) 4 MG tablet Take 1 tablet (4 mg total) by mouth every 8 (eight) hours as needed for nausea or vomiting.   OVER THE COUNTER MEDICATION Take 1 capsule by mouth daily. Juice Plus   No facility-administered encounter medications on file as of 09/24/2022.     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  No other pathogenic variants detected in [facility] Panel.  Report date is Jul 23, 2021.   The BRCAplus panel offered by [employer] and includes sequencing and  deletion/duplication analysis for the following 8 genes: ATM, BRCA1, BRCA2, CDH1, CHEK2, PALB2, PTEN, and TP53.  Results of pan-cancer panel are pending.    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   PONV (postoperative nausea and vomiting)    Seroma of breast    admitted with infection 11/05/21-11/09/21    Past Surgical History:  Procedure Laterality Date   BREAST RECONSTRUCTION WITH PLACEMENT OF TISSUE EXPANDER AND FLEX HD (ACELLULAR HYDRATED DERMIS) Bilateral 10/02/2021   Procedure: BREAST RECONSTRUCTION WITH PLACEMENT OF TISSUE EXPANDER AND FLEX HD (ACELLULAR HYDRATED DERMIS);  Surgeon: Peggye Form, DO;  Location: [facility];  Service: Plastics;  Laterality: Bilateral;   BREAST RECONSTRUCTION WITH PLACEMENT OF TISSUE EXPANDER AND FLEX HD (ACELLULAR HYDRATED DERMIS) Right 12/12/2021   Procedure: replacement of right breast expander for reconstruction;  Surgeon: Peggye Form, DO;  Location: [facility];  Service: Plastics;  Laterality: Right;  Requesting 1 hour   REMOVAL OF TISSUE EXPANDER Right 11/07/2021   Procedure: REMOVAL OF RIGHT TISSUE EXPANDER;  Surgeon: Peggye Form, DO;  Location: [facility];  Service: Plastics;  Laterality: Right;   REMOVAL OF TISSUE EXPANDER AND PLACEMENT OF IMPLANT Bilateral 03/21/2022   Procedure: REMOVAL OF TISSUE EXPANDER AND PLACEMENT OF IMPLANT;  Surgeon: Peggye Form, DO;  Location: [facility];  Service: Plastics;  Laterality: Bilateral;   SIMPLE MASTECTOMY WITH  AXILLARY SENTINEL NODE BIOPSY Bilateral 10/02/2021   Procedure: SIMPLE MASTECTOMY;  Surgeon: Darleene Cleaver, MD;  Location: [facility];  Service: General;  Laterality: Bilateral;    Family History  Problem Relation Age of Onset   Skin cancer Father        face; surgery only   Colon cancer Maternal Aunt        dx before 60?   Cancer Maternal Uncle        larynx; dx after 50   Lung cancer  Paternal Aunt        dx after 17   Lung cancer Paternal Uncle        dx after 31   Colon cancer Cousin        dx 37s; paternal female cousin   Ovarian cancer Cousin        dx 44s; paternal cousin    Social History   Social History Narrative   Not on file     Review of Systems General: Denies fevers or chills Cardio: Denies chest pain Pulmonary: Denies difficulty breathing  Physical Exam    04/16/2022    8:14 AM 04/11/2022    1:02 PM 03/26/2022   12:36 PM  Vitals with BMI  Systolic 144 162 409  Diastolic 91 96 92  Pulse 73 79 68    General:  No acute distress, nontoxic appearing  Respiratory: No increased work of breathing Neuro: Alert and oriented Psychiatric: Normal mood and affect   Assessment/Plan ***  Evelena Leyden 09/23/2022, 8:53 AM

## 2022-09-24 ENCOUNTER — Encounter: Payer: Self-pay | Admitting: Physician Assistant

## 2022-09-24 ENCOUNTER — Ambulatory Visit: Payer: Commercial Managed Care - PPO | Admitting: Physician Assistant

## 2022-09-24 VITALS — BP 135/89 | HR 66 | Ht 63.0 in | Wt 135.0 lb

## 2022-09-24 DIAGNOSIS — Z9013 Acquired absence of bilateral breasts and nipples: Secondary | ICD-10-CM

## 2022-09-24 DIAGNOSIS — Z9889 Other specified postprocedural states: Secondary | ICD-10-CM

## 2023-08-18 IMAGING — MG MM BREAST BX W LOC DEV 1ST LESION IMAGE BX SPEC STEREO GUIDE*L*
7 of 19 series · 7 of 31 positions shown · non-contrast
Comparison: Previous exams.
COMPARISON: Previous exams.

Addendum:
CLINICAL DATA: 41-year-old female with indeterminate left breast
calcifications.

EXAM:
LEFT BREAST STEREOTACTIC CORE NEEDLE BIOPSY

[L (1 of 7)]
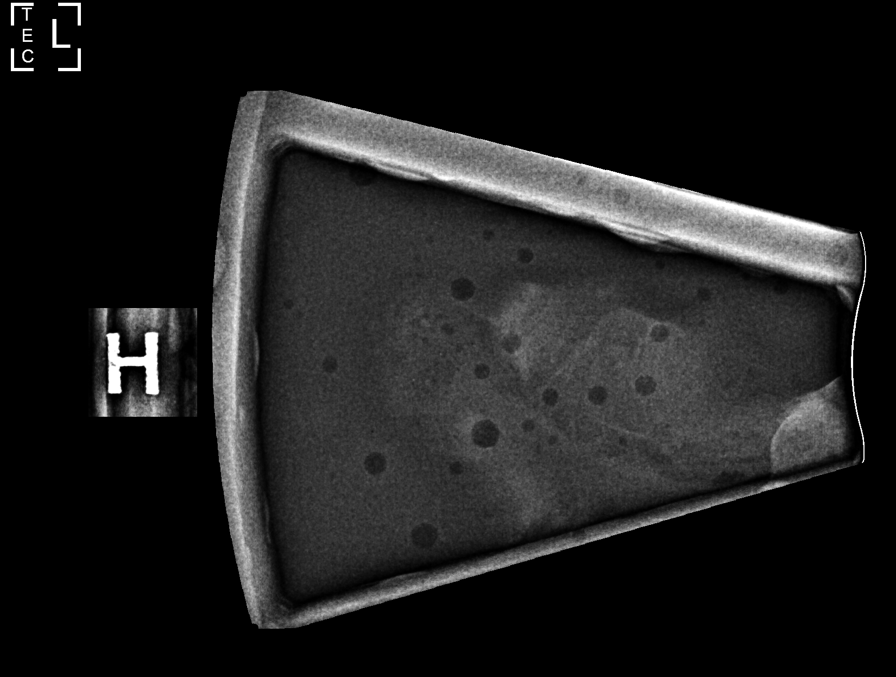

[L (2 of 7)]
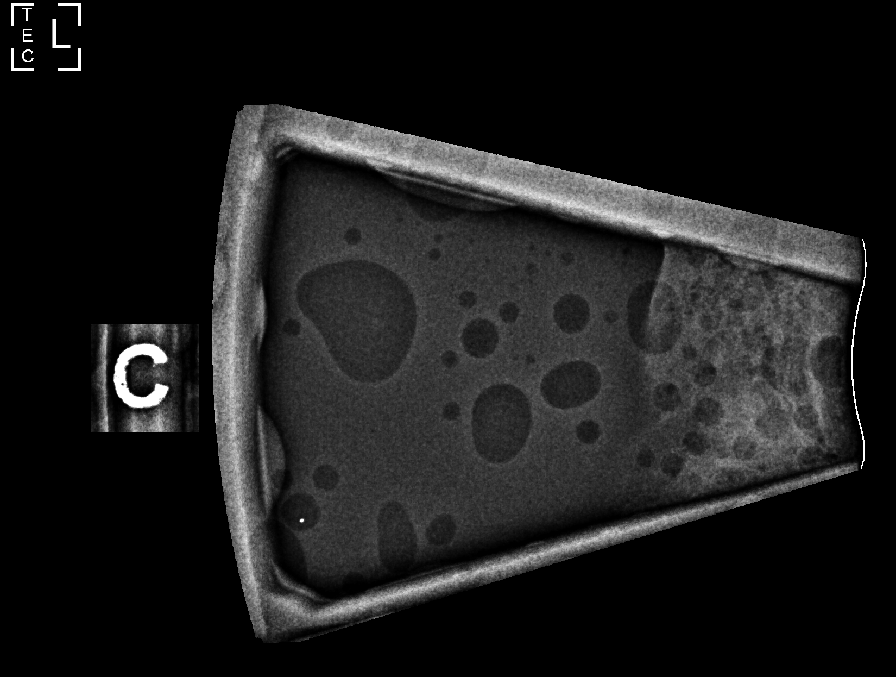

[L (3 of 7)]
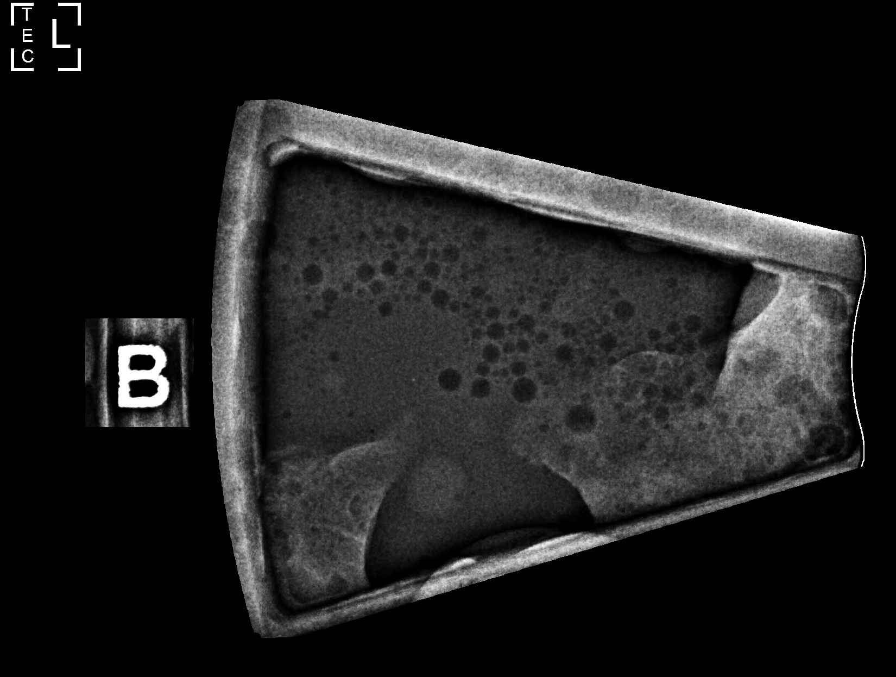

[L (4 of 7)]
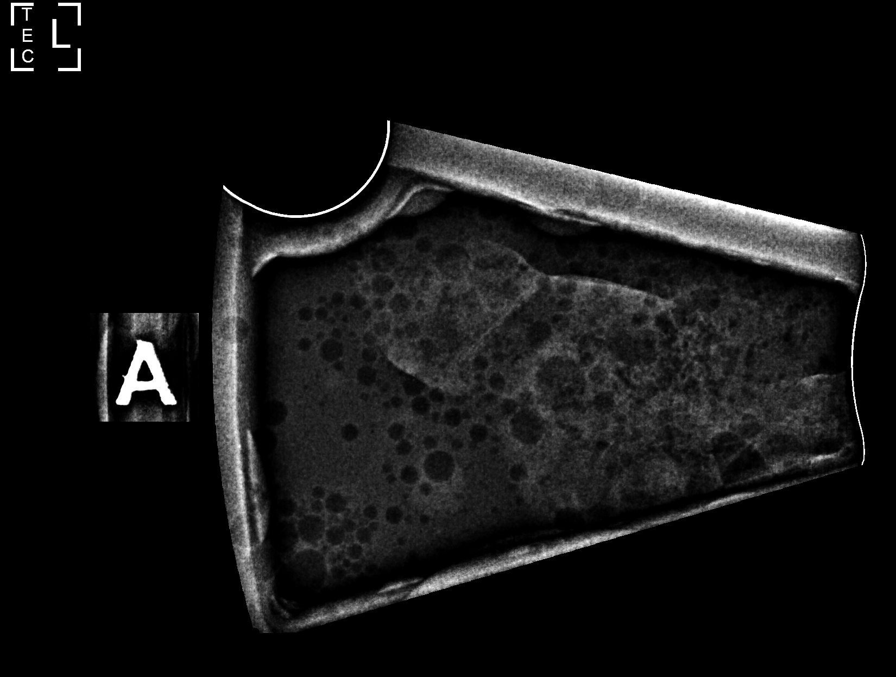

[L (5 of 7)]
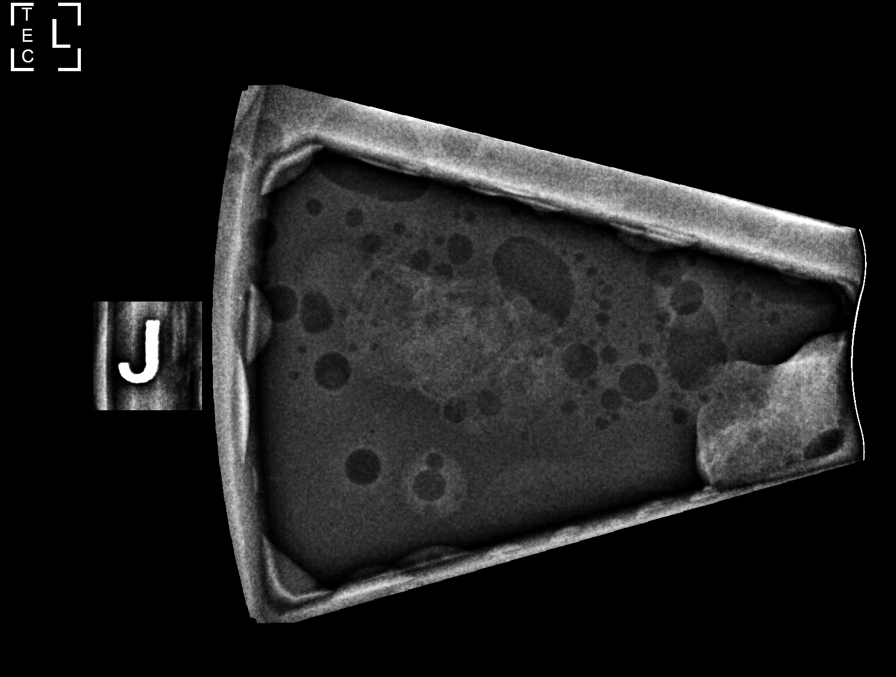

[L (6 of 7)]
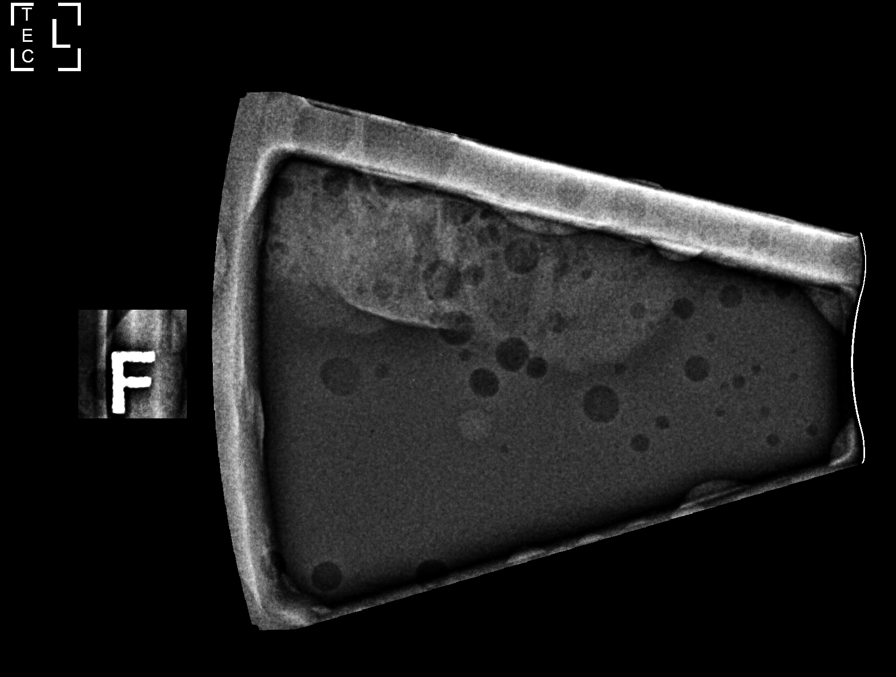

[L (7 of 7)]
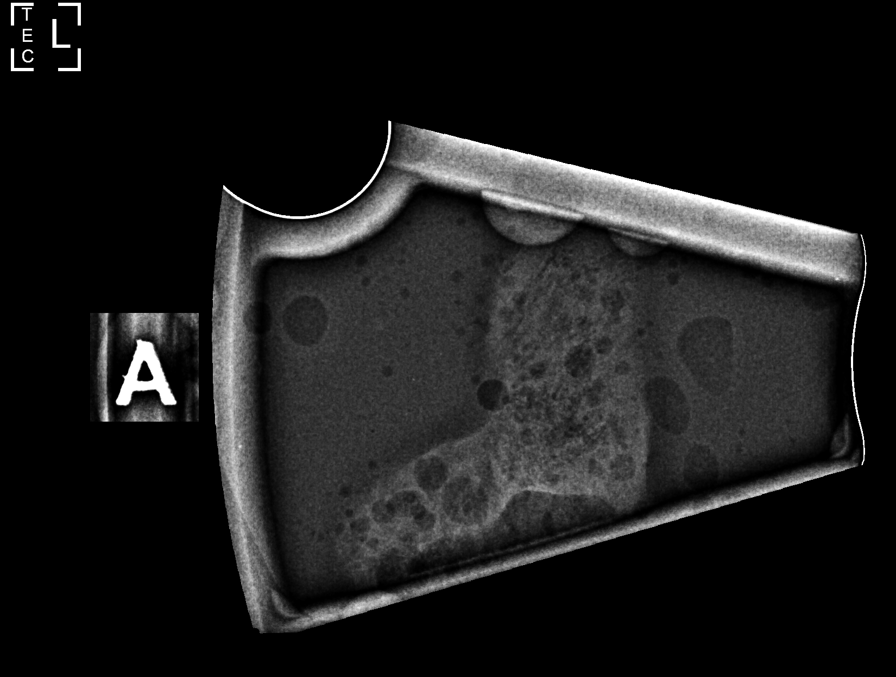

[7 of 31 positions shown; findings below may reference images not displayed]



Using sterile technique and 1% Lidocaine as local anesthetic, under
stereotactic guidance, a 9 gauge vacuum assisted device was used to
perform core needle biopsy of calcifications in the lower outer
quadrant of the left breast using a lateral approach. Specimen
radiograph was performed showing only a single punctate
calcification within 1 of the specimens, despite multiple rounds of
tissue sampling with re-evaluation and repositioning of the needle.
Specimens with calcifications are identified for pathology.

Lesion quadrant: Lower outer quadrant

At the conclusion of the procedure, an X shaped tissue marker clip
was deployed into the biopsy cavity. Follow-up 2-view mammogram was
performed and dictated separately.
IMPRESSION: Stereotactic-guided biopsy of the left breast. Only a single
punctate calcification was obtained after multiple rounds of tissue
sampling. Final recommendation will be made once pathology is
available. No apparent complications.

ADDENDUM:
Pathology revealed SCANT FOCI OF INTERMEDIATE GRADE DUCTAL CARCINOMA
IN SITU- FIBROCYSTIC CHANGE of the LEFT breast, lower inner
quadrant, anterior (X clip). This was found to be concordant by Dr.
Migdalia Katz.

Pathology results were discussed with the patient by telephone. The
patient reported doing well after the biopsy with tenderness at the
site. Post biopsy instructions and care were reviewed and questions
were answered. The patient was encouraged to call The [REDACTED]

Recommend LEFT breast rebiopsy for more adequate sampling of
calcifications.

The patient is scheduled for stereotactic guided biopsy of LEFT
breast calcifications on July 11, 2021 @0470 [REDACTED].

Per patient request, surgical consultation has been arranged with
Dr. Annamaria-Mare Dobar at [REDACTED]- [HOSPITAL]. Pathology
report and request faxed on June 28, 2021.

Pathology results reported by Romaldo Murrell RN on 06/28/2021.



Using sterile technique and 1% Lidocaine as local anesthetic, under
stereotactic guidance, a 9 gauge vacuum assisted device was used to
perform core needle biopsy of calcifications in the lower outer
quadrant of the left breast using a lateral approach. Specimen
radiograph was performed showing only a single punctate
calcification within 1 of the specimens, despite multiple rounds of
tissue sampling with re-evaluation and repositioning of the needle.
Specimens with calcifications are identified for pathology.

Lesion quadrant: Lower outer quadrant

At the conclusion of the procedure, an X shaped tissue marker clip
was deployed into the biopsy cavity. Follow-up 2-view mammogram was
performed and dictated separately.
IMPRESSION: Stereotactic-guided biopsy of the left breast. Only a single
punctate calcification was obtained after multiple rounds of tissue
sampling. Final recommendation will be made once pathology is
available. No apparent complications.

## 2023-08-22 NOTE — Progress Notes (Signed)
 CHIEF COMPLAINT: Chief Complaint  Patient presents with  . Gynecologic Exam    HPI:  Ms. Shelly Hensley is 43 y.o. who has presented for a wellness visit without concerns   ALLERGIES: Allergies  Allergen Reactions  . Tree Nuts Anaphylaxis    unknown    MEDICATIONS: Current Outpatient Medications on File Prior to Visit  Medication Sig Dispense Refill  . acetaminophen  (TYLENOL ) 500 mg tablet Take 500 mg by mouth.    . cholecalciferol, vitamin D3, (Vitamin D3) 100 mcg (4,000 unit) cap Take  by mouth.    . clotrimazole -betamethasone (LOTRISONE) 1-0.05 % cream Apply BID x 7 days 15 g 2  . diazePAM  (VALIUM ) 2 mg tablet Take 2 mg by mouth.    . ibuprofen  (MOTRIN ) 200 mg tablet Take 600 mg by mouth.    . loratadine (CLARITIN) 10 mg tablet     . ondansetron  (ZOFRAN ) 4 mg tablet Take 4 mg by mouth.    . valACYclovir (VALTREX) 1 gram tablet      No current facility-administered medications on file prior to visit.    PAST MEDICAL HISTORY:  has a past medical history of Breast cancer    (CMD), Cancer    (CMD), and Thyroid nodule.  PAST SURGICAL HISTORY: @PSHED @  OBSTETRIC HISTORY:  [id number]  GYNECOLOGIC HISTORY:    FAMILY HISTORY:  family history includes Anemia in her father; Arthritis in her father; Diabetes in her father; Hearing loss in her father; Heart disease in her father; Hyperlipidemia in her father and mother; Hypertension in her father and mother.me  SOCIAL HISTORY Social History   Social History Narrative  . Not on file     REVIEW OF SYSTEMS:  GENERAL: no fevers, chills, significant changes in weight  HEENT: no changes in vision or hearing BREAST: no pain, masses or nipple discharge RESPIRATORY: no cough,labored breathing or shortness of breath CARDIOVASCULAR: no chest pain or palpitations GASTROINTESTINAL: no nausea, vomiting or diarrhea, no abdominal pain, regular bowel movements GENITOURINARY: no dysuria, no vaginal discharge or irritation, denies  urinary incontinence, urgency or incomplete emptying. NEUROLOGICAL: no headaches, memory loss or weakness MUSCULOSKELETAL: no bone or joint pain SKIN: no rashes or lesions PSYCHIATRY: no changes in mood, no anxiety or depression.    PHYSICAL EXAM: BP 136/70   Ht 1.562 m (5' 1.5)   Wt 64 kg (141 lb)   BMI 26.21 kg/m  Body mass index is 26.21 kg/m.   GENERAL APPEARANCE:  Well developed, well groomed.  No acute distress. Color good. MENTAL STATUS: Appears alert and oriented. Affect appropriate. SKIN: Skin color and turgor normal. No suspicious lesions, masses, rashes, or ulcerations. Nails and hair appear normal. HEAD: Normocephalic. EARS: External ear w/o  masses or lesions.  Acuity to conversational tones good. EYES:  Lids w/o defect, conjunctiva and sclera appear normal.  NECK: Symmetric, trachea midline. Thyroid nontender  w/o enlargement or masses.   BREASTS: Bilateral implants which are symmetric w/o palpable masses, skin changes, or nipple inversion. No discharge, rash, or skin retraction by inspection.. CHEST: Respirations unlabored.   Chest wall symmetric with no masses. Breath sounds clear bilaterally w/o wheezes,  rales, or rhonchi. CV: Normal rate and rhythm, no murmurs, gallops or rubs.  GI/ABDOMEN: Abdomen soft and non tender. No guarding or rebound. No palpable masses or tenderness. Liver and spleen are w/o tenderness or enlargement.  GU: Normal vulva and introitus w/o masses, lesions, rashes, or swelling. Vaginal mucosa  pink and moist without exudates, lesions, or ulcerations. Uterus is  non tender, not enlarged and has smooth contour.  Adnexa not enlarged, non tender, and w/o masses. Cervix appears normal.   Lymphatic: no cervical, axillary, supraclavicular or inguinal adenopathy.  MS: Muscle tone and strength normal for age, w/o atrophy or abnormal movement. EXTREMITIES: Joints w/full ROM, no clubbing, cyanosis or edema  A chaperone was present for the exam portion of  the visit  COUNSELING AND EDUCATION  1. Encounter for annual routine gynecological examination        The ASCCP Pap Smear Guidelines delineating the frequency of Pap smear testing were reviewed with the patient today.  She has been instructed on monthly self-breast exam and we have discussed the importance of annual wellness exams and yearly mammograms starting at age 65.       Electronically signed by: Lorrene GORMAN Sines, MD 08/22/2023 9:52 AM

## 2023-12-16 ENCOUNTER — Other Ambulatory Visit

## 2023-12-16 ENCOUNTER — Ambulatory Visit: Admitting: Oncology

## 2023-12-22 ENCOUNTER — Other Ambulatory Visit: Payer: Self-pay | Admitting: Oncology

## 2023-12-22 DIAGNOSIS — D0512 Intraductal carcinoma in situ of left breast: Secondary | ICD-10-CM

## 2023-12-22 DIAGNOSIS — Z1501 Genetic susceptibility to malignant neoplasm of breast: Secondary | ICD-10-CM

## 2023-12-23 ENCOUNTER — Inpatient Hospital Stay: Attending: Oncology

## 2023-12-23 ENCOUNTER — Inpatient Hospital Stay: Admitting: Oncology

## 2023-12-23 ENCOUNTER — Encounter: Payer: Self-pay | Admitting: Oncology

## 2023-12-23 ENCOUNTER — Other Ambulatory Visit: Payer: Self-pay | Admitting: Oncology

## 2023-12-23 VITALS — BP 142/88 | HR 69 | Temp 98.2°F | Resp 16 | Ht 63.0 in | Wt 139.4 lb

## 2023-12-23 DIAGNOSIS — Z1589 Genetic susceptibility to other disease: Secondary | ICD-10-CM

## 2023-12-23 DIAGNOSIS — Z86000 Personal history of in-situ neoplasm of breast: Secondary | ICD-10-CM | POA: Diagnosis present

## 2023-12-23 DIAGNOSIS — Z1502 Genetic susceptibility to malignant neoplasm of ovary: Secondary | ICD-10-CM

## 2023-12-23 DIAGNOSIS — D0512 Intraductal carcinoma in situ of left breast: Secondary | ICD-10-CM

## 2023-12-23 DIAGNOSIS — Z1509 Genetic susceptibility to other malignant neoplasm: Secondary | ICD-10-CM

## 2023-12-23 DIAGNOSIS — Z1501 Genetic susceptibility to malignant neoplasm of breast: Secondary | ICD-10-CM

## 2023-12-23 DIAGNOSIS — Z9013 Acquired absence of bilateral breasts and nipples: Secondary | ICD-10-CM | POA: Insufficient documentation

## 2023-12-23 LAB — CMP (CANCER CENTER ONLY)
ALT: 16 U/L (ref 0–44)
AST: 30 U/L (ref 15–41)
Albumin: 4.4 g/dL (ref 3.5–5.0)
Alkaline Phosphatase: 50 U/L (ref 38–126)
Anion gap: 9 (ref 5–15)
BUN: 13 mg/dL (ref 6–20)
CO2: 27 mmol/L (ref 22–32)
Calcium: 9.8 mg/dL (ref 8.9–10.3)
Chloride: 105 mmol/L (ref 98–111)
Creatinine: 1.03 mg/dL — ABNORMAL HIGH (ref 0.44–1.00)
GFR, Estimated: >60 mL/min (ref >60)
Glucose, Bld: 97 mg/dL (ref 70–99)
Potassium: 4.1 mmol/L (ref 3.5–5.1)
Sodium: 141 mmol/L (ref 135–145)
Total Bilirubin: 0.3 mg/dL (ref 0.0–1.2)
Total Protein: 7.4 g/dL (ref 6.5–8.1)

## 2023-12-23 LAB — CBC WITH DIFFERENTIAL (CANCER CENTER ONLY)
Abs Immature Granulocytes: 0.01 K/uL (ref 0.00–0.07)
Basophils Absolute: 0.1 K/uL (ref 0.0–0.1)
Basophils Relative: 1 %
Eosinophils Absolute: 0.1 K/uL (ref 0.0–0.5)
Eosinophils Relative: 2 %
HCT: 39.6 % (ref 36.0–46.0)
Hemoglobin: 13.3 g/dL (ref 12.0–15.0)
Immature Granulocytes: 0 %
Lymphocytes Relative: 29 %
Lymphs Abs: 1.8 K/uL (ref 0.7–4.0)
MCH: 31.9 pg (ref 26.0–34.0)
MCHC: 33.6 g/dL (ref 30.0–36.0)
MCV: 95 fL (ref 80.0–100.0)
Monocytes Absolute: 0.6 K/uL (ref 0.1–1.0)
Monocytes Relative: 9 %
Neutro Abs: 3.6 K/uL (ref 1.7–7.7)
Neutrophils Relative %: 59 %
Platelet Count: 204 K/uL (ref 150–400)
RBC: 4.17 MIL/uL (ref 3.87–5.11)
RDW: 13.2 % (ref 11.5–15.5)
WBC Count: 6.2 K/uL (ref 4.0–10.5)
nRBC: 0 % (ref 0.0–0.2)

## 2023-12-23 NOTE — Progress Notes (Signed)
 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 She has now had bilateral mastectomies with immediate reconstruction.We discussed the concept of chemoprevention but this doesn't really apply since she no longer has breasts. She does not need further treatment.  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 She will need to get a baseline colonoscopy and repeat every 5 years.   Thyroid Nodule This is on the left side and is small and feels benign. She has had numerous evaluations and multiple biopsies which have all been benign. I would recommend regular follow up of her thyroid nodule.  PLAN: Shelly Hensley is here for follow-up of her left breast cancer, stage 0. She was last seen by me in 2023 and was lost to follow-up. Due to her CHEK2 mutation she will need a colonoscopy soon and we will work on scheduling this. She informed me that she opted for immediate reconstruction surgery after bilateral prophylactic mastectomies, and the right breast then became infected. She was admitted into the hospital and had the right implant removed before having it heal and replaced. Patient states that she feels well and has no complaints of pain. She had her annual appointment with gynecology in Spring, 2025. She has a WBC of 6.2, hemoglobin of 13.3, and platelet count of 204,000. Her CMP is normal other than a slightly elevated creatinine of 1.03. Her CA 125 today is pending and I will call her with the results. I encouraged her to increase her fluid intake and I will see her back in 1 year with CBC, CMP, and CA 125. I discussed the assessment and treatment plan with the patient.  The patient was  provided an opportunity to ask questions and all were answered.  The patient agreed with the plan and demonstrated an understanding of the instructions.  The patient was advised to call back if the symptoms worsen or if the condition fails to improve as anticipated.  I provided 21 minutes of face-to-face time during this this encounter and > 50% was spent counseling as documented under my assessment and plan.   Shelly VEAR Cornish, MD   CANCER CENTER [facility] - A DEPT OF [facility] [address] [place] [address] Dept: [phone] Dept Fax: [phone]   Orders Placed This Encounter  Procedures   Ambulatory referral to Gastroenterology    Referral Priority:   Routine    Referral Type:   Consultation    Referral Reason:   Specialty Services Required    Referred to Provider:   Misenheimer, Evalene, MD    Number of Visits Requested:   1      CHIEF COMPLAINTS/PURPOSE OF CONSULTATION:  CC: Left breast cancer  Treatment: Surveillance   HISTORY OF PRESENTING ILLNESS:  Shelly Hensley 43 y.o. female is here because of recent diagnosis of left breast DCIS. The cancer was detected by screening mammogram, done 05/11/21. She had a 7 mm cluster of calcifications in the lower left breast. The cancer was not palpable prior to diagnosis. She had a diagnostic mammogram 06/13/21, which revealed the breast tissue to be very dense. This confirmed a group  of indeterminate micro-calcifications, measuring 5 mm, which were mildly suspicious. A stereotactic biopsy was performed on 06/25/21, revealing a tiny area of DCIS. A repeat biopsy on 07/11/21 revealed DCIS intermediate grade, with estrogen receptors 100% and progesterone receptors 50%. Breast MRI on 08/13/21 confirmed a 4 mm area of enhancement at the biopsy site and no other abnormalities. 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  She had bilateral mastectomies on 10/02/21 with immediate reconstruction by Dr. Lowery. The right breast was negative. The left breast revealed a solid type DCIS with focal necrosis, in a background of PASH (pseudoangiomatous stromal hyperplasia). This involved 5 of 13 blocks.The deep margin was positive but the final pathology showed clear margins. Estrogen receptors were positive at 95% and PR at 40%. Dr. Joesph is her surgeon.   I reviewed her records extensively and collaborated the history with the patient.  SUMMARY OF ONCOLOGIC HISTORY: Oncology History  Carcinoma in situ of left breast  07/16/2021 Initial Diagnosis   Ductal carcinoma in situ (DCIS) of left breast   07/23/2021 Genetic Testing   CHEK2 mutation detected at c.1100delC (e.[id number]*84).  Variant of uncertain significance detected in CTNNA1 at  p.N169S (c.506A>G).  No other pathogenic or uncertain variants detected in [facility] CancerNext-Expanded +RNAinsight Panel.  Report date is Jul 23, 2021.   The CancerNext-Expanded gene panel offered by [facility] and includes sequencing, rearrangement, and RNA analysis for the following 77 genes: AIP, ALK, APC, ATM, AXIN2, BAP1, BARD1, BLM, BMPR1A, BRCA1, BRCA2, BRIP1, CDC73, CDH1, CDK4, CDKN1B, CDKN2A, CHEK2, CTNNA1, DICER1, FANCC, FH, FLCN, GALNT12, KIF1B, LZTR1, MAX, MEN1, MET, MLH1, MSH2, MSH3, MSH6, MUTYH, NBN, NF1, NF2, NTHL1, PALB2, PHOX2B, PMS2, POT1, PRKAR1A, PTCH1, PTEN, RAD51C, RAD51D, RB1, RECQL, RET, SDHA, SDHAF2, SDHB, SDHC, SDHD, SMAD4, SMARCA4, SMARCB1, SMARCE1, STK11, SUFU, TMEM127, TP53, TSC1, TSC2, VHL and XRCC2 (sequencing and deletion/duplication); EGFR, EGLN1, HOXB13, KIT, MITF, PDGFRA, POLD1, and POLE (sequencing only); EPCAM and GREM1 (deletion/duplication only).    10/02/2021 Cancer Staging   Staging form: Breast, AJCC 8th Edition -  Clinical stage from 10/02/2021: Stage 0 (cTis (DCIS), cN0, cM0, G3, ER+, PR+, HER2: Not Assessed) - Signed by Cornelius Shelly DEL, MD on 11/19/2021 Histopathologic type: Intraductal carcinoma, noninfiltrating, NOS Stage prefix: Initial diagnosis Method of lymph node assessment: Clinical Nuclear grade: G3 Multigene prognostic tests performed: None Histologic grading system: 3 grade system Laterality: Left Lymph-vascular invasion (LVI): LVI not present (absent)/not identified Diagnostic confirmation: Positive histology Specimen type: Excision Staged by: Managing physician Menopausal status: Premenopausal Stage used in treatment planning: Yes National guidelines used in treatment planning: Yes Type of national guideline used in treatment planning: NCCN    In terms of breast cancer risk profile:  She menarched at early age of 89 and still has regular menses. She had 1 pregnancy, her first child was born at age 69  She  received birth control pills for approximately years.  She was never exposed to fertility medications or hormone replacement therapy.  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 She was last seen by me in 2023 and was lost to follow-up. Due to her CHEK2 mutation she will need a colonoscopy soon and we will work on scheduling this. She informed me that she  opted for immediate reconstruction surgery after bilateral prophylactic mastectomies, and the right breast then became infected. She was admitted into the hospital and had the right implant removed before having it heal and replaced. Patient states that she feels well and has no complaints of pain. She had her annual appointment with gynecology in Spring, 2025. She has a WBC of 6.2, hemoglobin of 13.3, and platelet count of 204,000. Her CMP is normal other than a slightly elevated creatinine of 1.03. Her CA 125 today is pending and I will  call her with the results. I encouraged her to increase her fluid intake and I will see her back in 1 year with CBC, CMP, and CA 125. She denies fever, chills, night sweats, or other signs of infection. She denies cardiorespiratory and gastrointestinal issues. She  denies pain. Her appetite is good and Her weight is 139lbs today.    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  No other pathogenic variants detected in [facility] BRCAPlus Panel.  Report date is Jul 23, 2021.   The BRCAplus panel offered by [facility] and includes sequencing and deletion/duplication analysis for the following 8 genes: ATM, BRCA1, BRCA2, CDH1, CHEK2, PALB2, PTEN, and TP53.  Results of pan-cancer panel are pending.    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   PONV (postoperative nausea and vomiting)    Seroma of breast    admitted with infection 11/05/21-11/09/21  Thyroid nodule Allergic rhinitis  SURGICAL HISTORY: Past Surgical History:  Procedure Laterality Date   BREAST RECONSTRUCTION WITH PLACEMENT OF TISSUE EXPANDER AND FLEX HD (ACELLULAR HYDRATED DERMIS) Bilateral 10/02/2021   Procedure: BREAST RECONSTRUCTION WITH PLACEMENT OF TISSUE EXPANDER AND FLEX HD (ACELLULAR HYDRATED DERMIS);  Surgeon: Lowery Estefana RAMAN, DO;  Location: [facility];  Service: Plastics;  Laterality: Bilateral;   BREAST RECONSTRUCTION WITH PLACEMENT OF TISSUE EXPANDER AND FLEX HD (ACELLULAR HYDRATED DERMIS) Right 12/12/2021   Procedure: replacement of right breast expander for reconstruction;  Surgeon: Lowery Estefana RAMAN, DO;  Location: [facility];  Service: Plastics;  Laterality: Right;  Requesting 1 hour   REMOVAL OF TISSUE EXPANDER Right  11/07/2021   Procedure: REMOVAL OF RIGHT TISSUE EXPANDER;  Surgeon: Lowery Estefana RAMAN, DO;  Location: [facility];  Service: Plastics;  Laterality: Right;   REMOVAL OF TISSUE EXPANDER AND PLACEMENT OF IMPLANT Bilateral 03/21/2022   Procedure: REMOVAL OF TISSUE EXPANDER AND PLACEMENT OF IMPLANT;  Surgeon: Lowery Estefana RAMAN, DO;  Location: [facility];  Service: Plastics;  Laterality: Bilateral;   SIMPLE MASTECTOMY WITH AXILLARY SENTINEL NODE BIOPSY Bilateral 10/02/2021   Procedure: SIMPLE MASTECTOMY;  Surgeon: Joesph Lynwood DEL, MD;  Location: [facility];  Service: General;  Laterality: Bilateral;    SOCIAL HISTORY: Social History   Socioeconomic History   Marital status: Married    Spouse name: Shelly Hensley   Number of children: 1   Years of education: 12+2   Highest education level: Associate degree: academic program  Occupational History   Not on file  Tobacco Use   Smoking status: Never   Smokeless tobacco: Never  Vaping Use   Vaping status: Never Used  Substance and Sexual Activity   Alcohol use: Never   Drug use: Never  Sexual activity: Yes  Other Topics Concern   Not on file  Social History Narrative   Not on file   Social Drivers of Health   Financial Resource Strain: Not on file  Food Insecurity: Not on file  Transportation Needs: Not on file  Physical Activity: Not on file  Stress: Not on file  Social Connections: Not on file  Intimate Partner Violence: Not on file   FAMILY HISTORY: Family History  Problem Relation Age of Onset   Skin cancer Father        face; surgery only   Colon cancer Maternal Aunt        dx before 45?   Cancer Maternal Uncle        larynx; dx after 50   Lung cancer Paternal Aunt        dx after 41   Lung cancer Paternal Uncle        dx after 42   Colon cancer Cousin        dx 34s; paternal female cousin   Ovarian cancer Cousin        dx 69s; paternal cousin  Maternal uncle had a neuroendocrine tumor which  metastasized to the liver and brain  ALLERGIES:  is allergic to other.  MEDICATIONS:  Current Outpatient Medications  Medication Sig Dispense Refill   MAGNESIUM COMPLEX PO Take by mouth.     valACYclovir (VALTREX) 1000 MG tablet Take 2,000 mg by mouth 2 (two) times daily. (Patient taking differently: Take 2,000 mg by mouth as directed. Only takes as needed for fever blisters)     ALOE VERA PO Take 1 capsule by mouth daily.     Cholecalciferol (VITAMIN D-3 PO) Take 1 capsule by mouth daily.     OVER THE COUNTER MEDICATION Take 1 capsule by mouth daily. Juice Plus     No current facility-administered medications for this visit.    REVIEW OF SYSTEMS:   Review of Systems  Constitutional: Negative.  Negative for appetite change, chills, diaphoresis, fatigue, fever and unexpected weight change.  HENT:  Negative.  Negative for hearing loss, lump/mass, mouth sores, nosebleeds, sore throat, tinnitus, trouble swallowing and voice change.   Eyes: Negative.   Respiratory: Negative.  Negative for chest tightness, cough, hemoptysis, shortness of breath and wheezing.   Cardiovascular: Negative.  Negative for chest pain, leg swelling and palpitations.  Gastrointestinal: Negative.  Negative for abdominal distention, abdominal pain, blood in stool, constipation, diarrhea, nausea, rectal pain and vomiting.  Endocrine: Negative.   Genitourinary: Negative.  Negative for difficulty urinating, dysuria, frequency and hematuria.   Musculoskeletal:  Negative for arthralgias, back pain, flank pain, gait problem, myalgias, neck pain and neck stiffness.  Skin: Negative.  Negative for itching, rash and wound.  Neurological:  Negative for dizziness, extremity weakness, gait problem, headaches, light-headedness, numbness, seizures and speech difficulty.  Hematological: Negative.  Negative for adenopathy. Does not bruise/bleed easily.  Psychiatric/Behavioral: Negative.  Negative for confusion, decreased concentration,  depression and sleep disturbance. The patient is not nervous/anxious.    PHYSICAL EXAMINATION: ECOG PERFORMANCE STATUS: 0 - Asymptomatic Vitals:   12/23/23 1617  BP: (!) 142/88  Pulse: 69  Resp: 16  Temp: 98.2 F (36.8 C)  SpO2: 100%   Filed Weights   12/23/23 1617  Weight: 139 lb 6.4 oz (63.2 kg)   Physical Exam Vitals and nursing note reviewed.  Constitutional:      General: She is not in acute distress.    Appearance: Normal appearance. She is  normal weight.  HENT:     Head: Normocephalic and atraumatic.     Right Ear: Tympanic membrane, ear canal and external ear normal. There is no impacted cerumen.     Left Ear: Tympanic membrane, ear canal and external ear normal. There is no impacted cerumen.     Nose: Nose normal. No congestion or rhinorrhea.     Mouth/Throat:     Mouth: Mucous membranes are moist.     Pharynx: Oropharynx is clear. No oropharyngeal exudate or posterior oropharyngeal erythema.  Eyes:     General: No scleral icterus.       Right eye: No discharge.        Left eye: No discharge.     Extraocular Movements: Extraocular movements intact.     Conjunctiva/sclera: Conjunctivae normal.     Pupils: Pupils are equal, round, and reactive to light.  Neck:     Comments: Tiny 1cm nodule in the left anterior mid neck Cardiovascular:     Rate and Rhythm: Normal rate and regular rhythm.     Pulses: Normal pulses.     Heart sounds: Normal heart sounds. No murmur heard.    No friction rub. No gallop.  Pulmonary:     Effort: Pulmonary effort is normal. No respiratory distress.     Breath sounds: Normal breath sounds.  Chest:     Comments: Bilateral reconstructions are negative Abdominal:     General: Bowel sounds are normal. There is no distension.     Palpations: Abdomen is soft. There is no hepatomegaly, splenomegaly or mass.     Tenderness: There is no abdominal tenderness. There is no right CVA tenderness or guarding.  Musculoskeletal:        General:  Normal range of motion.     Cervical back: Normal range of motion and neck supple.     Right lower leg: No edema.     Left lower leg: No edema.  Lymphadenopathy:     Cervical: No cervical adenopathy.     Right cervical: No superficial, deep or posterior cervical adenopathy.    Left cervical: No superficial, deep or posterior cervical adenopathy.     Upper Body:     Right upper body: No supraclavicular, axillary or pectoral adenopathy.     Left upper body: No supraclavicular, axillary or pectoral adenopathy.  Skin:    General: Skin is warm and dry.     Comments: She has a couple dark nevi of the anterior abdominal wall which appear benign  Neurological:     General: No focal deficit present.     Mental Status: She is alert and oriented to person, place, and time. Mental status is at baseline.  Psychiatric:        Mood and Affect: Mood normal.        Behavior: Behavior normal.        Thought Content: Thought content normal.        Judgment: Judgment normal.    LABORATORY DATA:  I have reviewed the data as listed    Latest Ref Rng & Units 12/23/2023    4:03 PM 11/09/2021    1:13 AM 11/08/2021   12:56 AM  CBC  WBC 4.0 - 10.5 K/uL 6.2  5.3  5.2   Hemoglobin 12.0 - 15.0 g/dL 86.6  9.0  89.8   Hematocrit 36.0 - 46.0 % 39.6  26.5  29.7   Platelets 150 - 400 K/uL 204  170  147    Lab Results  Component Value Date   CREATININE 1.03 (H) 12/23/2023   BUN 13 12/23/2023   NA 141 12/23/2023   K 4.1 12/23/2023   CL 105 12/23/2023   CO2 27 12/23/2023      Component Value Date/Time   PROT 7.4 12/23/2023 1603   ALBUMIN 4.4 12/23/2023 1603   AST 30 12/23/2023 1603   ALT 16 12/23/2023 1603   ALKPHOS 50 12/23/2023 1603   BILITOT 0.3 12/23/2023 1603   RADIOGRAPHIC STUDIES: I have personally reviewed the radiological images as listed and agreed with the findings in the report. No results found.    I,Jasmine M Lassiter,acting as a scribe for Shelly VEAR Cornish, MD.,have documented  all relevant documentation on the behalf of Shelly VEAR Cornish, MD,as directed by  Shelly VEAR Cornish, MD while in the presence of Shelly VEAR Cornish, MD.

## 2023-12-24 LAB — CA 125: Cancer Antigen (CA) 125: 38.5 U/mL — ABNORMAL HIGH (ref 0.0–38.1)

## 2023-12-29 ENCOUNTER — Telehealth: Payer: Self-pay

## 2023-12-29 ENCOUNTER — Telehealth: Payer: Self-pay | Admitting: Oncology

## 2023-12-29 ENCOUNTER — Other Ambulatory Visit: Payer: Self-pay | Admitting: Oncology

## 2023-12-29 DIAGNOSIS — Z1501 Genetic susceptibility to malignant neoplasm of breast: Secondary | ICD-10-CM

## 2023-12-29 NOTE — Telephone Encounter (Signed)
 Patient has been scheduled for follow-up visit per 12/29/23 LOS.  Pt aware of scheduled appt details.

## 2023-12-29 NOTE — Telephone Encounter (Signed)
 Called patient back and notified her of message. Transferred to scheduling to get scheduled for labs in 6 months

## 2023-12-29 NOTE — Telephone Encounter (Signed)
-----   Message from [name] sent at 12/29/2023 11:17 AM EDT ----- Regarding: call Tell her the CA 125 was 38.5 and normal is officially up to 38.1.  Probably not significant but would like to repeat in 6 months.  This is test for ovarian cancer Pls schedule lab appt in 6 months.

## 2023-12-29 NOTE — Telephone Encounter (Signed)
 Attempted to contact patient. No answer.

## 2024-03-26 ENCOUNTER — Ambulatory Visit: Admitting: Plastic Surgery

## 2024-04-30 ENCOUNTER — Ambulatory Visit: Admitting: Plastic Surgery

## 2024-05-04 ENCOUNTER — Ambulatory Visit: Admitting: Plastic Surgery

## 2024-06-21 ENCOUNTER — Other Ambulatory Visit

## 2024-12-22 ENCOUNTER — Ambulatory Visit: Admitting: Oncology

## 2024-12-22 ENCOUNTER — Other Ambulatory Visit
